# Patient Record
Sex: Male | Born: 1955 | Race: Black or African American | Hispanic: No | Marital: Married | Smoking: Former smoker
Health system: Southern US, Community
[De-identification: ages and names within clinical notes are randomized; demographics above are authoritative.]

## PROBLEM LIST (undated history)

## (undated) DIAGNOSIS — J302 Other seasonal allergic rhinitis: Secondary | ICD-10-CM

## (undated) DIAGNOSIS — I10 Essential (primary) hypertension: Secondary | ICD-10-CM

## (undated) DIAGNOSIS — E119 Type 2 diabetes mellitus without complications: Secondary | ICD-10-CM

## (undated) DIAGNOSIS — M4800 Spinal stenosis, site unspecified: Secondary | ICD-10-CM

## (undated) DIAGNOSIS — M1990 Unspecified osteoarthritis, unspecified site: Secondary | ICD-10-CM

## (undated) DIAGNOSIS — E878 Other disorders of electrolyte and fluid balance, not elsewhere classified: Secondary | ICD-10-CM

## (undated) DIAGNOSIS — F419 Anxiety disorder, unspecified: Secondary | ICD-10-CM

## (undated) DIAGNOSIS — J4489 Other specified chronic obstructive pulmonary disease: Secondary | ICD-10-CM

## (undated) DIAGNOSIS — G2581 Restless legs syndrome: Secondary | ICD-10-CM

## (undated) DIAGNOSIS — R7303 Prediabetes: Secondary | ICD-10-CM

## (undated) DIAGNOSIS — E785 Hyperlipidemia, unspecified: Secondary | ICD-10-CM

## (undated) DIAGNOSIS — G4730 Sleep apnea, unspecified: Secondary | ICD-10-CM

## (undated) DIAGNOSIS — F410 Panic disorder [episodic paroxysmal anxiety] without agoraphobia: Secondary | ICD-10-CM

## (undated) DIAGNOSIS — K219 Gastro-esophageal reflux disease without esophagitis: Secondary | ICD-10-CM

## (undated) DIAGNOSIS — J45909 Unspecified asthma, uncomplicated: Secondary | ICD-10-CM

## (undated) HISTORY — PX: REPLACEMENT TOTAL KNEE: SUR1224

## (undated) HISTORY — PX: LUMBAR DISC SURGERY: SHX700

## (undated) HISTORY — PX: COLONOSCOPY: SHX174

## (undated) HISTORY — DX: Unspecified osteoarthritis, unspecified site: M19.90

## (undated) HISTORY — PX: GASTRIC RESECTION: SHX5248

## (undated) HISTORY — PX: BACK SURGERY: SHX140

## (undated) HISTORY — DX: Type 2 diabetes mellitus without complications: E11.9

## (undated) HISTORY — DX: Panic disorder (episodic paroxysmal anxiety): F41.0

## (undated) HISTORY — DX: Anxiety disorder, unspecified: F41.9

---

## 2003-08-12 ENCOUNTER — Ambulatory Visit (HOSPITAL_COMMUNITY): Admission: RE | Admit: 2003-08-12 | Discharge: 2003-08-12 | Payer: Self-pay | Admitting: Family Medicine

## 2003-08-29 ENCOUNTER — Ambulatory Visit (HOSPITAL_COMMUNITY): Admission: RE | Admit: 2003-08-29 | Discharge: 2003-08-29 | Payer: Self-pay | Admitting: Family Medicine

## 2003-09-07 ENCOUNTER — Inpatient Hospital Stay (HOSPITAL_COMMUNITY): Admission: EM | Admit: 2003-09-07 | Discharge: 2003-09-10 | Payer: Self-pay | Admitting: Emergency Medicine

## 2004-01-31 ENCOUNTER — Ambulatory Visit: Payer: Self-pay | Admitting: Family Medicine

## 2004-02-01 ENCOUNTER — Ambulatory Visit (HOSPITAL_COMMUNITY): Admission: RE | Admit: 2004-02-01 | Discharge: 2004-02-01 | Payer: Self-pay | Admitting: Family Medicine

## 2004-02-03 ENCOUNTER — Ambulatory Visit (HOSPITAL_COMMUNITY): Admission: RE | Admit: 2004-02-03 | Discharge: 2004-02-03 | Payer: Self-pay | Admitting: Family Medicine

## 2004-02-10 ENCOUNTER — Ambulatory Visit: Payer: Self-pay | Admitting: *Deleted

## 2004-03-02 ENCOUNTER — Ambulatory Visit: Payer: Self-pay | Admitting: Family Medicine

## 2004-04-01 ENCOUNTER — Ambulatory Visit: Payer: Self-pay | Admitting: Family Medicine

## 2004-04-06 ENCOUNTER — Ambulatory Visit: Payer: Self-pay | Admitting: Family Medicine

## 2004-07-06 ENCOUNTER — Ambulatory Visit: Payer: Self-pay | Admitting: Family Medicine

## 2004-07-16 ENCOUNTER — Ambulatory Visit: Payer: Self-pay | Admitting: Family Medicine

## 2004-08-06 ENCOUNTER — Ambulatory Visit: Payer: Self-pay | Admitting: Cardiovascular Disease

## 2004-08-24 ENCOUNTER — Ambulatory Visit: Payer: Self-pay

## 2004-09-03 ENCOUNTER — Ambulatory Visit: Payer: Self-pay | Admitting: Family Medicine

## 2004-09-04 ENCOUNTER — Ambulatory Visit (HOSPITAL_COMMUNITY): Admission: RE | Admit: 2004-09-04 | Discharge: 2004-09-04 | Payer: Self-pay | Admitting: Family Medicine

## 2004-09-21 ENCOUNTER — Ambulatory Visit: Payer: Self-pay | Admitting: Family Medicine

## 2004-10-19 ENCOUNTER — Emergency Department (HOSPITAL_COMMUNITY): Admission: EM | Admit: 2004-10-19 | Discharge: 2004-10-19 | Payer: Self-pay | Admitting: Emergency Medicine

## 2004-11-25 ENCOUNTER — Ambulatory Visit: Payer: Self-pay | Admitting: Family Medicine

## 2004-11-27 ENCOUNTER — Ambulatory Visit (HOSPITAL_COMMUNITY): Admission: RE | Admit: 2004-11-27 | Discharge: 2004-11-27 | Payer: Self-pay | Admitting: Family Medicine

## 2004-12-18 ENCOUNTER — Ambulatory Visit: Payer: Self-pay | Admitting: Family Medicine

## 2004-12-21 ENCOUNTER — Ambulatory Visit: Payer: Self-pay | Admitting: Internal Medicine

## 2005-02-01 ENCOUNTER — Emergency Department (HOSPITAL_COMMUNITY): Admission: EM | Admit: 2005-02-01 | Discharge: 2005-02-01 | Payer: Self-pay | Admitting: Emergency Medicine

## 2005-02-03 ENCOUNTER — Ambulatory Visit: Payer: Self-pay | Admitting: Family Medicine

## 2005-03-08 ENCOUNTER — Ambulatory Visit: Payer: Self-pay | Admitting: Nurse Practitioner

## 2005-03-15 ENCOUNTER — Ambulatory Visit: Payer: Self-pay | Admitting: Family Medicine

## 2005-03-17 ENCOUNTER — Ambulatory Visit (HOSPITAL_COMMUNITY): Admission: RE | Admit: 2005-03-17 | Discharge: 2005-03-17 | Payer: Self-pay | Admitting: Family Medicine

## 2005-03-18 ENCOUNTER — Emergency Department (HOSPITAL_COMMUNITY): Admission: EM | Admit: 2005-03-18 | Discharge: 2005-03-18 | Payer: Self-pay | Admitting: Emergency Medicine

## 2005-03-19 ENCOUNTER — Emergency Department (HOSPITAL_COMMUNITY): Admission: EM | Admit: 2005-03-19 | Discharge: 2005-03-19 | Payer: Self-pay | Admitting: Emergency Medicine

## 2005-03-29 ENCOUNTER — Emergency Department (HOSPITAL_COMMUNITY): Admission: EM | Admit: 2005-03-29 | Discharge: 2005-03-29 | Payer: Self-pay | Admitting: Family Medicine

## 2005-03-30 ENCOUNTER — Ambulatory Visit: Payer: Self-pay | Admitting: Family Medicine

## 2005-04-28 ENCOUNTER — Ambulatory Visit: Payer: Self-pay | Admitting: Family Medicine

## 2005-06-29 ENCOUNTER — Ambulatory Visit: Payer: Self-pay | Admitting: Family Medicine

## 2005-07-23 ENCOUNTER — Inpatient Hospital Stay (HOSPITAL_COMMUNITY): Admission: RE | Admit: 2005-07-23 | Discharge: 2005-07-26 | Payer: Self-pay | Admitting: Orthopedic Surgery

## 2005-08-16 ENCOUNTER — Encounter: Admission: RE | Admit: 2005-08-16 | Discharge: 2005-11-14 | Payer: Self-pay | Admitting: Orthopedic Surgery

## 2005-09-08 ENCOUNTER — Ambulatory Visit (HOSPITAL_COMMUNITY): Admission: RE | Admit: 2005-09-08 | Discharge: 2005-09-09 | Payer: Self-pay | Admitting: Orthopedic Surgery

## 2005-09-24 ENCOUNTER — Ambulatory Visit: Payer: Self-pay | Admitting: Family Medicine

## 2005-10-11 ENCOUNTER — Ambulatory Visit: Payer: Self-pay | Admitting: Family Medicine

## 2005-11-15 ENCOUNTER — Encounter: Admission: RE | Admit: 2005-11-15 | Discharge: 2006-02-13 | Payer: Self-pay | Admitting: Orthopedic Surgery

## 2006-04-20 ENCOUNTER — Encounter: Admission: RE | Admit: 2006-04-20 | Discharge: 2006-04-20 | Payer: Self-pay | Admitting: Internal Medicine

## 2006-12-06 DIAGNOSIS — M1710 Unilateral primary osteoarthritis, unspecified knee: Secondary | ICD-10-CM

## 2006-12-06 DIAGNOSIS — E7800 Pure hypercholesterolemia, unspecified: Secondary | ICD-10-CM | POA: Insufficient documentation

## 2006-12-06 DIAGNOSIS — Z87898 Personal history of other specified conditions: Secondary | ICD-10-CM | POA: Insufficient documentation

## 2006-12-06 DIAGNOSIS — F411 Generalized anxiety disorder: Secondary | ICD-10-CM | POA: Insufficient documentation

## 2006-12-06 DIAGNOSIS — J309 Allergic rhinitis, unspecified: Secondary | ICD-10-CM | POA: Insufficient documentation

## 2006-12-06 DIAGNOSIS — F329 Major depressive disorder, single episode, unspecified: Secondary | ICD-10-CM

## 2006-12-06 DIAGNOSIS — D3500 Benign neoplasm of unspecified adrenal gland: Secondary | ICD-10-CM | POA: Insufficient documentation

## 2006-12-06 DIAGNOSIS — M5137 Other intervertebral disc degeneration, lumbosacral region: Secondary | ICD-10-CM

## 2006-12-06 DIAGNOSIS — K279 Peptic ulcer, site unspecified, unspecified as acute or chronic, without hemorrhage or perforation: Secondary | ICD-10-CM | POA: Insufficient documentation

## 2006-12-06 DIAGNOSIS — I10 Essential (primary) hypertension: Secondary | ICD-10-CM | POA: Insufficient documentation

## 2006-12-06 DIAGNOSIS — M51379 Other intervertebral disc degeneration, lumbosacral region without mention of lumbar back pain or lower extremity pain: Secondary | ICD-10-CM | POA: Insufficient documentation

## 2006-12-24 ENCOUNTER — Emergency Department (HOSPITAL_COMMUNITY): Admission: EM | Admit: 2006-12-24 | Discharge: 2006-12-24 | Payer: Self-pay | Admitting: Family Medicine

## 2007-09-09 ENCOUNTER — Emergency Department (HOSPITAL_COMMUNITY): Admission: EM | Admit: 2007-09-09 | Discharge: 2007-09-09 | Payer: Self-pay | Admitting: Emergency Medicine

## 2007-09-26 ENCOUNTER — Emergency Department (HOSPITAL_COMMUNITY): Admission: EM | Admit: 2007-09-26 | Discharge: 2007-09-26 | Payer: Self-pay | Admitting: Emergency Medicine

## 2007-11-22 ENCOUNTER — Ambulatory Visit (HOSPITAL_BASED_OUTPATIENT_CLINIC_OR_DEPARTMENT_OTHER): Admission: RE | Admit: 2007-11-22 | Discharge: 2007-11-22 | Payer: Self-pay | Admitting: Orthopedic Surgery

## 2007-11-25 ENCOUNTER — Emergency Department (HOSPITAL_COMMUNITY): Admission: EM | Admit: 2007-11-25 | Discharge: 2007-11-25 | Payer: Self-pay | Admitting: Emergency Medicine

## 2008-02-13 ENCOUNTER — Emergency Department (HOSPITAL_COMMUNITY): Admission: EM | Admit: 2008-02-13 | Discharge: 2008-02-13 | Payer: Self-pay | Admitting: Emergency Medicine

## 2008-03-25 ENCOUNTER — Emergency Department (HOSPITAL_COMMUNITY): Admission: EM | Admit: 2008-03-25 | Discharge: 2008-03-25 | Payer: Self-pay | Admitting: Emergency Medicine

## 2008-04-17 ENCOUNTER — Inpatient Hospital Stay (HOSPITAL_COMMUNITY): Admission: RE | Admit: 2008-04-17 | Discharge: 2008-04-21 | Payer: Self-pay | Admitting: Orthopedic Surgery

## 2008-04-17 ENCOUNTER — Ambulatory Visit: Payer: Self-pay | Admitting: *Deleted

## 2008-05-13 ENCOUNTER — Emergency Department (HOSPITAL_COMMUNITY): Admission: EM | Admit: 2008-05-13 | Discharge: 2008-05-13 | Payer: Self-pay | Admitting: Emergency Medicine

## 2008-05-13 ENCOUNTER — Encounter (INDEPENDENT_AMBULATORY_CARE_PROVIDER_SITE_OTHER): Payer: Self-pay | Admitting: Emergency Medicine

## 2008-05-13 ENCOUNTER — Encounter: Admission: RE | Admit: 2008-05-13 | Discharge: 2008-08-11 | Payer: Self-pay | Admitting: Orthopedic Surgery

## 2008-06-27 ENCOUNTER — Emergency Department (HOSPITAL_COMMUNITY): Admission: EM | Admit: 2008-06-27 | Discharge: 2008-06-27 | Payer: Self-pay | Admitting: Emergency Medicine

## 2008-06-30 ENCOUNTER — Emergency Department (HOSPITAL_COMMUNITY): Admission: EM | Admit: 2008-06-30 | Discharge: 2008-06-30 | Payer: Self-pay | Admitting: Emergency Medicine

## 2008-10-23 ENCOUNTER — Emergency Department (HOSPITAL_COMMUNITY): Admission: EM | Admit: 2008-10-23 | Discharge: 2008-10-23 | Payer: Self-pay | Admitting: Emergency Medicine

## 2008-11-13 ENCOUNTER — Emergency Department (HOSPITAL_COMMUNITY): Admission: EM | Admit: 2008-11-13 | Discharge: 2008-11-13 | Payer: Self-pay | Admitting: Emergency Medicine

## 2009-04-10 ENCOUNTER — Encounter: Admission: RE | Admit: 2009-04-10 | Discharge: 2009-05-01 | Payer: Self-pay | Admitting: Neurology

## 2009-05-27 ENCOUNTER — Emergency Department (HOSPITAL_COMMUNITY): Admission: EM | Admit: 2009-05-27 | Discharge: 2009-05-27 | Payer: Self-pay | Admitting: Emergency Medicine

## 2009-09-11 ENCOUNTER — Emergency Department (HOSPITAL_COMMUNITY): Admission: EM | Admit: 2009-09-11 | Discharge: 2009-09-11 | Payer: Self-pay | Admitting: Emergency Medicine

## 2009-11-19 ENCOUNTER — Encounter
Admission: RE | Admit: 2009-11-19 | Discharge: 2009-12-24 | Payer: Self-pay | Admitting: Physical Medicine and Rehabilitation

## 2009-12-02 ENCOUNTER — Emergency Department (HOSPITAL_COMMUNITY): Admission: EM | Admit: 2009-12-02 | Discharge: 2009-12-03 | Payer: Self-pay | Admitting: Emergency Medicine

## 2010-03-02 ENCOUNTER — Encounter: Admission: RE | Admit: 2010-03-02 | Discharge: 2010-03-02 | Payer: Self-pay | Admitting: Specialist

## 2010-04-01 ENCOUNTER — Ambulatory Visit (HOSPITAL_BASED_OUTPATIENT_CLINIC_OR_DEPARTMENT_OTHER): Admission: RE | Admit: 2010-04-01 | Discharge: 2010-04-01 | Payer: Self-pay | Admitting: Orthopedic Surgery

## 2010-05-06 ENCOUNTER — Ambulatory Visit (HOSPITAL_COMMUNITY)
Admission: RE | Admit: 2010-05-06 | Discharge: 2010-05-07 | Payer: Self-pay | Source: Home / Self Care | Attending: Specialist | Admitting: Specialist

## 2010-05-07 LAB — BASIC METABOLIC PANEL
BUN: 8 mg/dL (ref 6–23)
CO2: 31 mEq/L (ref 19–32)
Calcium: 9.1 mg/dL (ref 8.4–10.5)
Chloride: 102 mEq/L (ref 96–112)
Creatinine, Ser: 1.15 mg/dL (ref 0.4–1.5)
GFR calc Af Amer: >60 mL/min (ref >60)
GFR calc non Af Amer: >60 mL/min (ref >60)
Glucose, Bld: 117 mg/dL — ABNORMAL HIGH (ref 70–99)
Potassium: 4.2 mEq/L (ref 3.5–5.1)
Sodium: 139 mEq/L (ref 135–145)

## 2010-05-07 LAB — CBC
HCT: 36.6 % — ABNORMAL LOW (ref 39.0–52.0)
Hemoglobin: 12 g/dL — ABNORMAL LOW (ref 13.0–17.0)
MCH: 27.4 pg (ref 26.0–34.0)
MCHC: 32.8 g/dL (ref 30.0–36.0)
MCV: 83.6 fL (ref 78.0–100.0)
Platelets: 198 10*3/uL (ref 150–400)
RBC: 4.38 MIL/uL (ref 4.22–5.81)
RDW: 13.7 % (ref 11.5–15.5)
WBC: 8.5 10*3/uL (ref 4.0–10.5)

## 2010-05-23 ENCOUNTER — Encounter: Payer: Self-pay | Admitting: Specialist

## 2010-06-09 ENCOUNTER — Ambulatory Visit: Payer: Self-pay | Admitting: Rehabilitation

## 2010-07-13 LAB — COMPREHENSIVE METABOLIC PANEL
ALT: 26 U/L (ref 0–53)
Albumin: 4.1 g/dL (ref 3.5–5.2)
Alkaline Phosphatase: 42 U/L (ref 39–117)
Creatinine, Ser: 1.09 mg/dL (ref 0.4–1.5)
GFR calc Af Amer: >60 mL/min (ref >60)
Potassium: 3.9 mEq/L (ref 3.5–5.1)
Sodium: 140 mEq/L (ref 135–145)
Total Bilirubin: 0.7 mg/dL (ref 0.3–1.2)

## 2010-07-13 LAB — CBC
HCT: 38.8 % — ABNORMAL LOW (ref 39.0–52.0)
Hemoglobin: 13.2 g/dL (ref 13.0–17.0)
MCH: 28.3 pg (ref 26.0–34.0)
MCHC: 34 g/dL (ref 30.0–36.0)
MCV: 83.3 fL (ref 78.0–100.0)
Platelets: 258 10*3/uL (ref 150–400)
RBC: 4.66 MIL/uL (ref 4.22–5.81)
WBC: 4.8 10*3/uL (ref 4.0–10.5)

## 2010-07-13 LAB — URINALYSIS, ROUTINE W REFLEX MICROSCOPIC
Glucose, UA: NEGATIVE mg/dL
Urobilinogen, UA: 1 mg/dL (ref 0.0–1.0)

## 2010-07-13 LAB — SURGICAL PCR SCREEN: Staphylococcus aureus: NEGATIVE

## 2010-07-13 LAB — PROTIME-INR: Prothrombin Time: 12.7 seconds (ref 11.6–15.2)

## 2010-07-14 LAB — POCT I-STAT 4, (NA,K, GLUC, HGB,HCT)
Glucose, Bld: 92 mg/dL (ref 70–99)
HCT: 43 % (ref 39.0–52.0)
Potassium: 4.7 mEq/L (ref 3.5–5.1)
Sodium: 141 mEq/L (ref 135–145)

## 2010-07-19 LAB — URINALYSIS, ROUTINE W REFLEX MICROSCOPIC
Bilirubin Urine: NEGATIVE
Glucose, UA: NEGATIVE mg/dL
Ketones, ur: NEGATIVE mg/dL
Protein, ur: NEGATIVE mg/dL
pH: 6 (ref 5.0–8.0)

## 2010-07-19 LAB — COMPREHENSIVE METABOLIC PANEL
ALT: 28 U/L (ref 0–53)
AST: 30 U/L (ref 0–37)
Albumin: 4.1 g/dL (ref 3.5–5.2)
Calcium: 9.6 mg/dL (ref 8.4–10.5)
Creatinine, Ser: 1.08 mg/dL (ref 0.4–1.5)
GFR calc Af Amer: >60 mL/min (ref >60)
Sodium: 138 mEq/L (ref 135–145)
Total Protein: 7.1 g/dL (ref 6.0–8.3)

## 2010-07-19 LAB — POCT CARDIAC MARKERS
Myoglobin, poc: 69.8 ng/mL (ref 12–200)
Troponin i, poc: <0.05 ng/mL (ref 0.00–0.09)
Troponin i, poc: <0.05 ng/mL (ref 0.00–0.09)

## 2010-07-19 LAB — DIFFERENTIAL
Eosinophils Absolute: 0.1 10*3/uL (ref 0.0–0.7)
Eosinophils Relative: 3 % (ref 0–5)
Lymphocytes Relative: 35 % (ref 12–46)
Lymphs Abs: 1.5 10*3/uL (ref 0.7–4.0)
Monocytes Relative: 9 % (ref 3–12)

## 2010-07-19 LAB — CBC
MCHC: 34.7 g/dL (ref 30.0–36.0)
MCV: 84.6 fL (ref 78.0–100.0)
Platelets: 244 10*3/uL (ref 150–400)
RBC: 4.84 MIL/uL (ref 4.22–5.81)
RDW: 13.9 % (ref 11.5–15.5)

## 2010-08-17 LAB — CBC
HCT: 36 % — ABNORMAL LOW (ref 39.0–52.0)
MCHC: 33.4 g/dL (ref 30.0–36.0)
MCV: 83.1 fL (ref 78.0–100.0)
Platelets: 350 10*3/uL (ref 150–400)
RDW: 14.2 % (ref 11.5–15.5)

## 2010-08-17 LAB — BASIC METABOLIC PANEL
BUN: 13 mg/dL (ref 6–23)
Chloride: 104 mEq/L (ref 96–112)
Creatinine, Ser: 0.93 mg/dL (ref 0.4–1.5)
Glucose, Bld: 96 mg/dL (ref 70–99)
Potassium: 4.1 mEq/L (ref 3.5–5.1)

## 2010-08-17 LAB — D-DIMER, QUANTITATIVE: D-Dimer, Quant: 3 ug/mL-FEU — ABNORMAL HIGH (ref 0.00–0.48)

## 2010-08-17 LAB — DIFFERENTIAL
Basophils Absolute: 0.1 10*3/uL (ref 0.0–0.1)
Basophils Relative: 2 % — ABNORMAL HIGH (ref 0–1)
Eosinophils Absolute: 0.1 10*3/uL (ref 0.0–0.7)
Eosinophils Relative: 2 % (ref 0–5)
Neutrophils Relative %: 65 % (ref 43–77)

## 2010-08-18 LAB — CBC
HCT: 37.9 % — ABNORMAL LOW (ref 39.0–52.0)
Platelets: 274 10*3/uL (ref 150–400)
RDW: 14.3 % (ref 11.5–15.5)
WBC: 3.4 10*3/uL — ABNORMAL LOW (ref 4.0–10.5)

## 2010-08-18 LAB — URINALYSIS, ROUTINE W REFLEX MICROSCOPIC
Bilirubin Urine: NEGATIVE
Glucose, UA: NEGATIVE mg/dL
Hgb urine dipstick: NEGATIVE
Specific Gravity, Urine: 1.02 (ref 1.005–1.030)

## 2010-08-18 LAB — COMPREHENSIVE METABOLIC PANEL
AST: 26 U/L (ref 0–37)
Albumin: 4.2 g/dL (ref 3.5–5.2)
Alkaline Phosphatase: 51 U/L (ref 39–117)
BUN: 9 mg/dL (ref 6–23)
Chloride: 101 mEq/L (ref 96–112)
GFR calc Af Amer: >60 mL/min (ref >60)
Potassium: 3.8 mEq/L (ref 3.5–5.1)
Sodium: 134 mEq/L — ABNORMAL LOW (ref 135–145)
Total Protein: 6.5 g/dL (ref 6.0–8.3)

## 2010-08-18 LAB — POCT I-STAT, CHEM 8
BUN: 10 mg/dL (ref 6–23)
Creatinine, Ser: 1 mg/dL (ref 0.4–1.5)
Potassium: 4 mEq/L (ref 3.5–5.1)
Sodium: 141 mEq/L (ref 135–145)

## 2010-08-18 LAB — DIFFERENTIAL
Basophils Relative: 0 % (ref 0–1)
Eosinophils Relative: 2 % (ref 0–5)
Monocytes Absolute: 0.3 10*3/uL (ref 0.1–1.0)
Monocytes Relative: 7 % (ref 3–12)
Neutro Abs: 2.4 10*3/uL (ref 1.7–7.7)

## 2010-09-15 NOTE — Discharge Summary (Signed)
[name], [name] NO.:  [id number]   MEDICAL RECORD NO.:  [id number]          PATIENT TYPE:  INP   LOCATION:  1609                         FACILITY:  [facility]   PHYSICIAN:  Ollen Gross, M.D.    DATE OF BIRTH:  1956/03/15   DATE OF ADMISSION:  04/17/2008  DATE OF DISCHARGE:  04/21/2008                               DISCHARGE SUMMARY   ADMISSION DIAGNOSES:  1. End-stage osteoarthritis of the left knee.  2. Anxiety.  3. Hypertension.  4. Hypercholesterolemia.  5. Reflux disease.  6. Benign prostatic hypertrophy.   DISCHARGE DIAGNOSES:  1. Left total knee arthroplasty.  2. Anxiety.  3. Hypertension.  4. Hypercholesterolemia.  5. Reflux disease.  6. Benign prostatic hypertrophy.   HISTORY OF PRESENT ILLNESS:  The patient is a 55 year old male with a  history of pain and difficulty with range of motion with his left knee.  He has failed conservative treatment.  He elected to proceed with a  total knee arthroplasty.  X-ray shows he has end-stage osteoarthritis of  the left knee.   SURGICAL PROCEDURES:  On April 17, 2008, the patient was taken to the  OR by Dr. Lequita Halt, assisted by Avel Peace, PA-C.  Under general  anesthesia, the patient underwent a left total knee arthroplasty with a  DePuy rotating platform system.  Estimated blood loss was 200 mL.  No  drains.  Tourniquet time was 13 minutes.  The patient had the following  components implanted:  A size 4 femoral component; a size 3 keel tibial  tray; a size 4 10 mL thickness polyethylene bearing; a size 41 3-peg  patella.  All components were implanted with polymethyl methacrylate.  The patient was transferred to recovery room and then to the orthopedic  floor to follow a total knee protocol.   CONSULTANTS:  Following routine consults were requested:  1. Physical Therapy.  2. Case Management.  3. Pharmacy for Coumadin monitoring.   HOSPITAL COURSE:  On April 17, 2008, the patient was  admitted to  [facility] under the care of Dr. Ollen Gross.  The patient  was taken to the OR where a left total knee arthroplasty was performed  without any complications.  The patient was transferred to the recovery  room and then to the orthopedic floor in good condition with IV pain  medicines, antibiotics and Coumadin for DVT prophylaxis.  The patient  then incurred 3 days postoperative course in which the patient had no  significant untoward events.  He was able to wean off his IV antibiotics  and medications to use p.o. meds well.  His wound remained benign for  any signs of infection.  His leg remained neuromotor and vascularly  intact.  Vital signs remained stable.  He remained afebrile.  The  patient worked well with physical therapy.  He was able to ambulate  greater than 260 feet.  Arrangements were made on postop day number 3  for outpatient home health physical therapy and Coumadin monitoring per  protocol and he was discharged in good condition.   LABORATORY DATA:  CBC on admission found WBCs 4.9, hemoglobin 13.9,  hematocrit 41.6, platelets 257, H and H on discharge was 10.4 and 30.5.  INR was 2.0 on discharge.  Routine chemistries remained within normal  limits during his hospitalization except for his glucose which was  slightly labile between 109-140.  No medication changes were made.  Urinalysis on admission was within normal limits.   DIAGNOSTICS:  Chest x-ray on admission found no acute or active process  identified.   DISCHARGE INSTRUCTIONS:  1. Diet is a heart-healthy diet.  2. Activity:  May increase activity slowly with using of crutches.  He      may shower.  3. Wound care:  He is to change his dressing on a daily basis.   FOLLOW UP:  1. He needs a follow up appointment with Dr. Lequita Halt 2 weeks from his      date of surgery.  He is to call [phone] for that follow up      appointment.  2. Home health physical therapy and RN for Coumadin  monitoring through      [facility].   MEDICATIONS:  1. Percocet 5/325 one-two tablets every 4-6 hours for pain if needed.  2. Robaxin one 500 mg tablet every 6 hours for muscle spasms if      needed.  3. Coumadin 10 mg a day unless changed by [facility] physical      therapy pharmacist.  4. Pravastatin 20 mg once a day.  5. Darvocet on hold until done with Percocet  6. Doxazosin 8 mg once a day.  7. Trazodone 50 mg one-half to 1 tablet before bedtime.  8. Flexeril on hold until done with Robaxin.  9. Risperidone 3 mg 2 tablets at bedtime.  10.Nexium 40 mg once a day.   CONDITION ON DISCHARGE:  Improved and good.      Jamelle Rushing, P.A.      Ollen Gross, M.D.  Electronically Signed   RWK/MEDQ  D:  06/05/2008  T:  06/05/2008  Job:  [id number]   cc:   Ollen Gross, M.D.  Fax: [phone]

## 2010-09-15 NOTE — Op Note (Signed)
[name], [name] NO.:  [id number]   MEDICAL RECORD NO.:  [id number]          PATIENT TYPE:  AMB   LOCATION:  NESC                         FACILITY:  [facility]   PHYSICIAN:  Ollen Gross, M.D.    DATE OF BIRTH:  [birth year]   DATE OF PROCEDURE:  11/22/2007  DATE OF DISCHARGE:                               OPERATIVE REPORT   PREOPERATIVE DIAGNOSIS:  Left medial meniscal tear.   POSTOPERATIVE DIAGNOSIS:  Left medial meniscal tear, plus chondral  defect, medial femoral condyle.   PROCEDURE:  Left knee arthroscopy with meniscal debridement and  chondroplasty.   SURGEON:  Ollen Gross, M.D.   ASSISTANT:  None.   ANESTHESIA:  General.   ESTIMATED BLOOD LOSS:  Minimal.   DRAINS:  None.   COMPLICATIONS:  In good condition and stable to recovery room.   BRIEF CLINICAL NOTE:  [name] is a 55 year old male who has had a several-  month history of progressively worsening left knee pain and mechanical  symptoms.  Plain radiographs show minimal degenerative change.  MRA  showed a medial meniscal tear.  Exam and history were consistent with  that.  He has failed nonoperative management and presents for  arthroscopic debridement.   PROCEDURE IN DETAIL:  After successful administration of general  anesthetic, the patient's left lower extremity was prepped and draped in  the usual sterile fashion.  Standard superomedial and inferolateral  incisions were made.  An inflow cannula passed superomedial, camera  passed inferolateral.  Arthroscopic visualization proceeds.  The under-  surface of the patella shows some grade 2 and 3 changes diffusely.  There is a tiny area of grade 4 change.  The trochlea has some grade 2  and 3 changes but no exposed bone.  Medial and lateral gutters are  visualized.  There are no loose bodies.  Flexion and valgus force  applied until the medial compartment is entered.  He does have a  significant tear, body and posterior horn of the medial  meniscus.  He  also has about a 1 x 2 cm area of chondral delamination on the medial  femoral condyle.  A spinal needle was used to localize the inferior  medial portal.  A small incision made, dilator placed, and the meniscus  is debrided back to a stable base with baskets and a 4.2 mm shaver and  then sealed off with the ArthroCare device.  The shaver was then used to  debride the unstable cartilage from the surface of the medial femoral  condyle and then to round off the cartilaginous edges.  I abraded the  bone in that exposed area.  This was about a 1 x 2 cm area.  The rest of  the condyle had minimal involvement.  The intercondylar notch is  visualized.  The ACL looks normal.  The lateral compartment is entered,  and it looks normal.  I inspected the joint again.  There are no further  tears, loose bodies, or chondral defects.  Arthroscopic equipment  was then removed from the inferior portals, which were closed with  interrupted  4-0 nylon.  Then 20 cc of 0.25% Marcaine with epi were  injected through the inflow cannula, then that is removed and that  portal closed with nylon.  A bulky sterile dressing is applied.  He is  then awakened and transported to recovery in stable condition.      Ollen Gross, M.D.  Electronically Signed     FA/MEDQ  D:  11/22/2007  T:  11/22/2007  Job:  [id number]

## 2010-09-15 NOTE — Op Note (Signed)
[name], [name] NO.:  [id number]   MEDICAL RECORD NO.:  [id number]          PATIENT TYPE:  INP   LOCATION:  1609                         FACILITY:  [facility]   PHYSICIAN:  Ollen Gross, M.D.    DATE OF BIRTH:  12-09-1955   DATE OF PROCEDURE:  04/17/2008  DATE OF DISCHARGE:                               OPERATIVE REPORT   PREOPERATIVE DIAGNOSIS:  Osteoarthritis, left knee.   POSTOPERATIVE DIAGNOSIS:  Osteoarthritis, left knee.   PROCEDURE:  Left total knee arthroplasty.   SURGEON:  Ollen Gross, M.D.   ASSISTANT:  Avel Peace P.A.-C   ANESTHESIA:  General, with postop Marcaine pain pump.   ESTIMATED BLOOD LOSS:  200.   DRAIN:  None.   TOURNIQUET TIME:  13 minutes at 300 mmHg.   COMPLICATIONS:  None.   CONDITION.:  Stable to recovery.   CLINICAL NOTE:  [name] is a 55 year old male who has developed severe end-  stage arthritis of the left knee with progressive worsening pain and  dysfunction.  He has failed nonoperative management and presents for  total knee arthroplasty.   PROCEDURE IN DETAIL:  After successful administration of general  anesthetic, a tourniquet was placed on the left thigh and left lower  extremity prepped and draped in the usual sterile fashion.  The  extremity was wrapped with an Esmarch, the knee flexed, the tourniquet  inflated to 300 mmHg.  Midline incision was made with a 10-blade through  subcutaneous tissues to the level of the extensor mechanism.  A fresh  blade was used to make a medial parapatellar arthrotomy.  Soft tissue of  the proximal medial tibia is subperiosteally elevated to the joint line  with the knife into the semimembranosus bursa with a Cobb elevator.  Soft tissue laterally is elevated with attention being paid to avoiding  the patellar tendon on tibial tubercle.  Patella subluxed laterally,  knee flexed 90 degrees, and ACL and PCL removed.  A drill was used to  create a starting hole in the distal  femur.  A 5-degree left valgus  alignment guide was placed and referencing off the posterior condyles  rotations marked and the block pinned to remove 11 mm off the distal  femur.  Eleven mm was taken because of preop flexion contracture.  Distal femoral resection was made with an oscillating saw.  Sizing block  was placed and size 4 is most appropriate.  Rotation is marked off the  epicondylar axis.  A size 4 cutting block was placed and the anterior-  posterior and chamfer cuts made.   The tibia subluxed forward and the menisci are removed.  The  extramedullary tibial alignment guide was placed referencing proximally  at the medial aspect of the tibial tubercle and distally along the  second metatarsal axis and tibial crest.  The block is pinned to remove  about 10 mm of the nondeficient lateral side.  Tibial resection was made  with an oscillating saw.  Size 3 is the most appropriate tibial  component and the proximal tibia was prepared with the modular drill and  keel punch for the size 3.  Femoral preparation is completed with the  intercondylar cut.   Size 3 mobile-bearing tibial trial, size 4 posterior stabilized femoral  trial, and a 10-mm posterior stabilized rotating platform insert trial  are placed.  With the 10 full extension is achieved with excellent varus-  valgus, anterior-posterior balance throughout full range of motion.  The  patella was then everted, it was measured to 24 mm.  Freehand resection  is taken to 14 mm, 41 template is placed, lug holes were drilled, trial  patella was placed and it tracks normally.  Osteophytes are removed off  the posterior femur.  All trials were removed and the cut bone surfaces  are prepared with pulsatile lavage.  Cement was mixed and once ready for  implantation the size 3 mobile-bearing tibial tray, size 4 posterior  stabilized femur, and 41 patella are cemented into place and the patella  was held with the clamp.  Trial 10-mm  insert was placed, knee held in  full extension, and all extruded cement removed.  Please note that the  tourniquet was not functioning well during the procedure and it had been  released at the total time of 13 minutes.  Once the cement was hardened  and the permanent inserts in, then the wound was copiously irrigated  with saline solution.  The FloSeal was injected in the medial and  lateral gutters and suprapatellar area.  A moist sponge is placed.  We  held this for about 2 minutes and then removed it.  Essentially all the  bleeding had stopped at that point.  Any bleeding that still persisted  was stopped with electrocautery.  The wound was then copiously irrigated  again with saline solution and the arthrotomy closed with interrupted #1  PDS.  Flexion against gravity is 135 degrees.  Subcu was closed with  interrupted 2-0 Vicryl and subcuticular running 4-0 Monocryl.  The  incision is cleaned and dried and then the catheter for Marcaine pain  pump is placed and the pump is initiated.  Steri-Strips were placed as  well as a bulky sterile dressing.  He is then placed into a knee  immobilizer, awakened, and transferred to recovery in stable condition.      Ollen Gross, M.D.  Electronically Signed     FA/MEDQ  D:  04/17/2008  T:  04/18/2008  Job:  [id number]

## 2010-09-18 NOTE — Op Note (Signed)
NAMENEMESIO, CASTRILLON NO.:  [id number]   MEDICAL RECORD NO.:  [id number]           PATIENT TYPE:   LOCATION:                                 FACILITY:   PHYSICIAN:  Ollen Gross, M.D.         DATE OF BIRTH:   DATE OF PROCEDURE:  09/08/2005  DATE OF DISCHARGE:                                 OPERATIVE REPORT   PREOPERATIVE DIAGNOSIS:  Arthrofibrosis right knee.   POSTOPERATIVE DIAGNOSIS:  Arthrofibrosis right knee.   PROCEDURE:  Right knee closed manipulation.   SURGEON:  Ollen Gross, M.D.   ANESTHESIA:  General.  Premanipulation, range of motion 20-70;  postmanipulation range of motion 5-120.   COMPLICATIONS:  None.   CONDITION:  Stable to recovery.   BRIEF CLINICAL NOTE:  Akari is a 55 year old male who has arthrofibrosis of  his right knee post total knee arthroplasty.  He had his operation done on  07/23/2005 and has not progressed well with therapy.  He has been stuck at  about 70-75 degrees of flexion for several weeks now.  He has scar tissue  that has formed, and presents now for closed manipulation.   PROCEDURE IN DETAIL:  After successful administration of general anesthetic,  exam under anesthesia showed range 20-70.  By placing my chest on his  proximal tibia, I flexed the knee and the adhesions are audibly lysed.  I  was easily able to get him flexed to 120 degrees.  His patella mobility  improved significantly.  I was able to get him extended to within 5 degrees  of full extension.  We placed him through a range of motion, again, and  achieved the same range.  He subsequently awakened and was transported to  recovery in stable condition.      Ollen Gross, M.D.  Electronically Signed     FA/MEDQ  D:  09/08/2005  T:  09/09/2005  Job:  [id number]

## 2010-09-18 NOTE — Discharge Summary (Signed)
[name], [name] NO.:  [id number]   MEDICAL RECORD NO.:  [id number]         PATIENT TYPE:  LINP   LOCATION:  1510                         FACILITY:  [facility]   PHYSICIAN:  Ollen Gross, M.D.    DATE OF BIRTH:  Nov 14, 1955   DATE OF ADMISSION:  07/23/2005  DATE OF DISCHARGE:                                 DISCHARGE SUMMARY   ADMITTING DIAGNOSES:  1.  Bilateral knees osteoarthritis, right more symptomatic than left.  2.  History of panic attacks.  3.  Anxiety.  4.  Migraines.  5.  Allergies.  6.  Sinus problems.  7.  Hypertension.  8.  Reflux disease.  9.  History of ulcers.  10. Arthritis.   DISCHARGE DIAGNOSES:  1.  Osteoarthritis, right knee, status post right total knee arthroplasty.  2.  Osteoarthritis, left knee.  3.  Mild postoperative blood loss anemia, did not require transfusion.  4.  Hypokalemia.  5.  History of panic attacks.  6.  Anxiety.  7.  Migraines.  8.  Allergies.  9.  Sinus problems.  10. Hypertension.  11. Reflux disease.  12. History of ulcers.  13. Arthritis.   PROCEDURE:  On July 23, 2005, right total knee.  Surgeon:  Ollen Gross,  M.D.  Assistant:  A. Perkins, PA-C.  Anesthesia general.  Postop pain pump, Hemovac drain x 1.  Tourniquet time  45 minutes.   CONSULTS:  None.   BRIEF HISTORY:  [name] is a 55 year old male with end-stage osteoarthritis of  the right knee with intractable pain, large loose body in the knee.  Failed  nonoperative management including injections and now presents for total knee  arthroplasty.   LABORATORY DATA:  Preop CBC:  Hemoglobin 14.5, hematocrit 42.9, white cell  count 4.5.  Postop hemoglobin 12.1, last noted H&H 11.2 and 33.8.  PT and  PTT 12.2 and 26, respectively, on admission.  INR 0.9.  Serial pro times  followed and last noted PT and INR 19.3 and 1.6.  Chem panel on admission  all within normal limits.  Serial electrolytes were followed.  Potassium  dropped some from 3.7 to 3.2.   Preop UA negative.  Blood group type B  negative.   EKG dated July 20, 2005, normal sinus rhythm, nonspecific T-wave  abnormalities, no significant change since last tracing from January 19, 2005.  Confirmed by Dr. Kristeen Miss.   HOSPITAL COURSE:  The patient was admitted to [facility] and  tolerated the procedure well.  Later, transferred to the recovery room and  the orthopedic floor.  Started on PCA and p.o. analgesics.  Hemovac drain  placed at surgery was pulled on day one.  Did have a fair amount of pain on  day one but was using PCA.  Weaned over to p.o. meds and DCd the PCA on the  following day.  He did get up out of bed on day one and ambulated about 5  feet.  By day two, pain was much better, low-grade temp.  Treated with  incentive spirometry and antipyretics.  PCA was  discontinued on day two.  From a therapy standpoint, got up and ambulated 80 feet and then later 125  feet later that afternoon.  Did so well on day two, he was ready to go home  by day three.   DISCHARGE PLAN:  The patient was discharged home on July 26, 2005.   DISCHARGE DIAGNOSES:  Please see above.   DISCHARGE MEDICATIONS:  Percocet, Robaxin, Coumadin.   DIET:  Resume previous home diet.   FOLLOWUP:  Follow up in two weeks in the office.   ACTIVITY:  Total knee protocol, weightbearing as tolerated.  Home health PT  and home health nursing.   DISPOSITION:  Home.   CONDITION ON DISCHARGE:  Improved.      [name], P.A.      Ollen Gross, M.D.  Electronically Signed    ALP/MEDQ  D:  09/01/2005  T:  09/02/2005  Job:  [id number]   cc:   Maurice March, M.D.  Fax: [phone]

## 2010-09-18 NOTE — Discharge Summary (Signed)
[name], [name]                            ACCOUNT NO.:  [id number]   MEDICAL RECORD NO.:  [id number]                   PATIENT TYPE:  INP   LOCATION:  5704                                 FACILITY:  [facility]   PHYSICIAN:  Jackie Plum, M.D.             DATE OF BIRTH:  [birth year]   DATE OF ADMISSION:  09/06/2003  DATE OF DISCHARGE:  09/10/2003                                 DISCHARGE SUMMARY   DISCHARGE DIAGNOSES:  1. Abdominal pain, resolved.     a. CT scan of the abdomen and pelvis notable for colonic wall thickening.     b. Colonoscopy done by Dr. Juanda Chance on Sep 09, 2003, was negative for any        inflammatory changes, neoplasia, or mucosal changes.  2. History of hypertension.  3. History of degenerative joint disease.  4. History of allergies.  5. History of low back pain.  6. History of migraines.   DISCHARGE MEDICATIONS:  The patient is going to resume his preadmission  medicines.  He will be taking Protonix 40 mg daily and Cardura for blood  pressure 4 mg p.o. daily.  Milk of magnesia over the counter has been  prescribed for the patient as needed.   ACTIVITY:  As tolerated.   DIET:  To be a low-salt diet.   FOLLOWUP:  The patient is to follow up with Dr. Juanda Chance as needed for  abdominal problems.   CONSULTANTS:  Lina Sar, M.D.   PROCEDURES:  Colonoscopy as noted above.   REASON FOR HOSPITALIZATION:  Abdominal pain.   HISTORY OF PRESENT ILLNESS:  The patient was admitted with right-sided  abdominal pain with a low-grade fever, diaphoresis, and dizziness.  He had  also been vomiting as well.  On evaluation in the ED, the patient was noted  to have colonic wall thickening and the hospitalist service was asked to  evaluate for admission.   PHYSICAL EXAMINATION:  Per H&P per Sherin Quarry, M.D.:  VITAL SIGNS:  His temperature on admission was 100.4 degrees with a BP of  120/90 and a pulse of 110 per minute.  CHEST:  Clear to auscultation.  CARDIAC:  Exam  was unremarkable.  ABDOMEN:  Exam was notable for tenderness in the right lower quadrant and  left lower quadrant.  Bowel sounds were present.   HOSPITAL COURSE:  He was admitted for further management.  He was admitted  to the hospitalist service.  A bowel rest regimen was started.  IV fluid  supplementation.  Pain medications were also given.  Empiric antibiotics  with Unasyn were started.  The patient was seen in consultation by Dr.  Juanda Chance, who recommended followup colonoscopy for further evaluation of  bowel wall thickening.  Colonoscopy as negative.  At this point, the  patient's GI presentation is unclear and he is being discharged home on the  above medications.  Dr. Juanda Chance has requested that  the patient see her on an  as needed basis.   DISCHARGE LABORATORIES:  WBC count 12.1, hemoglobin 14.0, hematocrit 40.4,  MCV 84.4, platelet count 245.  Sodium 139, potassium 4.1, chloride 106, CO2  28, glucose 88, BUN 5, creatinine 0.5, calcium 8.5.                                                Jackie Plum, M.D.    GO/MEDQ  D:  09/10/2003  T:  09/10/2003  Job:  [id number]   cc:   Lina Sar, M.D. [facility]

## 2010-09-18 NOTE — H&P (Signed)
NAMEGREGOREY, NABOR NO.:  [id number]   MEDICAL RECORD NO.:  [id number]          PATIENT TYPE:  INP   LOCATION:  1510                         FACILITY:  [facility]   PHYSICIAN:  Ollen Gross, M.D.    DATE OF BIRTH:  [birth year]   DATE OF ADMISSION:  07/23/2005  DATE OF DISCHARGE:  07/26/2005                                HISTORY & PHYSICAL   CHIEF COMPLAINT:  Right knee pain.   HISTORY OF PRESENT ILLNESS:  The patient is a 55 year old male who has been  seen by Dr. Lequita Halt in second opinion for evaluation of his knees.  He has  had problems ongoing since his teenage years.  He has had surgery on his  right knee in the past and has had significant problems develop since then.  He was recommended back in 2001 that he needed knee replacement but felt he  was too young at that time.  He is at a stage now where essentially his knee  is interfering with his life and his ability to do things he enjoys.  He is  seen in the office where he has end-stage arthritis in the right knee with  bone-on-bone in medial and patellofemoral compartments with large lateral  marginal osteophytes.  He does have some varus inclination of the knee.  It  is felt he reached the point where he would benefit from undergoing knee  replacement.  The risks and benefits have been discussed.  The patient is  subsequently admitted to the hospital.   ALLERGIES:  No known drug allergies.   CURRENT MEDICATIONS:  Protonix, hydrochlorothiazide, pravastatin, doxazosin,  Zyrtec, trazodone, Risperdal, methocarbamol, Asmanex, Nasacort.   PAST MEDICAL HISTORY:  1.  History of panic attacks.  2.  Anxiety.  3.  Migraines.  4.  Allergies.  5.  Sinus problems.  6.  Hypertension.  7.  Reflux disease.  8.  History of ulcers.  9.  Arthritis.   PAST SURGICAL HISTORY:  1.  Head surgery.  2.  Right knee surgery.  3.  Ulcer surgery.   SOCIAL HISTORY:  Married, nonsmoker.  Seldom intake of alcohol - maybe  one  drink a week.  Has three step-children.   FAMILY HISTORY:  Mother with a history of heart disease, hypertension, and  arthritis.   REVIEW OF SYSTEMS:  GENERAL:  No fevers, chills, night sweats. NEURO:  No  seizure, syncope, or paralysis.  RESPIRATORY:  Occasional cough with  allergies.  No shortness of breath, productive cough, or hemoptysis.  CARDIOVASCULAR:  No chest pain, angina, or orthopnea.  GI:  No nausea,  vomiting, diarrhea, constipation.  GU:  No dysuria, hematuria, discharge.  MUSCULOSKELETAL:  Right knee.   PHYSICAL EXAMINATION:  VITAL SIGNS:  Pulse 68, respiratory rate 14, blood  pressure 126/88.  GENERAL:  A 55 year old African-American male, well-nourished, well-  developed, no acute distress, slightly overweight.  He is alert, oriented,  and cooperative.  Accompanied by his wife.  HEENT:  Normocephalic, atraumatic.  Pupils round and reactive.  Oropharynx  clear, EOMs intact.  NECK:  Supple.  No carotid bruits are appreciated.  CHEST:  Clear anterior/posterior chest walls.  No rhonchi, rales, or  wheezing.  HEART:  Regular rate and rhythm without murmur.  ABDOMEN:  Soft, nontender, bowel sounds present, slightly round.  RECTAL, BREAST, GENITALIA:  Not done, not pertinent to present illness.  EXTREMITIES:  Right knee:  The right knee shows slight varus malalignment  with 5-100 degrees range of motion and moderate crepitus noted.  The left  knee shows range of motion 0-125 with slight crepitus, no effusion.   IMPRESSION:  Bilateral knee osteoarthritis, right more symptomatic than the  left.   PLAN:  The patient will be admitted to [facility] to undergo a  right total knee arthroplasty.  Surgery will be performed by Dr. Ollen Gross.      Alexzandrew L. Julien Girt, P.A.      Ollen Gross, M.D.  Electronically Signed    ALP/MEDQ  D:  08/01/2005  T:  08/03/2005  Job:  [id number]   cc:   Maurice March, M.D.  Fax: [phone]

## 2010-09-18 NOTE — Op Note (Signed)
Colin, Cox NO.:  [id number]   MEDICAL RECORD NO.:  [id number]          PATIENT TYPE:  INP   LOCATION:  1510                         FACILITY:  [facility]   PHYSICIAN:  Ollen Gross, M.D.    DATE OF BIRTH:  01/12/1956   DATE OF PROCEDURE:  07/23/2005  DATE OF DISCHARGE:  07/26/2005                                 OPERATIVE REPORT   PREOPERATIVE DIAGNOSIS:  Osteoarthritis, right knee.   POSTOPERATIVE DIAGNOSIS:  Osteoarthritis, right knee.   PROCEDURE:  Right total knee arthroplasty.   SURGEON:  Dr. Lequita Halt   ASSISTANT:  Avel Peace, PA-C   ANESTHESIA:  General with postop Marcaine pain pump.   ESTIMATED BLOOD LOSS:  Minimal.   DRAIN:  Hemovac x1.   TOURNIQUET TIME:  45 minutes at 300 mmHg.   COMPLICATIONS:  None.   CONDITION:  Stable to recovery.   BRIEF CLINICAL NOTE:  [name] is a 55 year old male with end-stage  osteoarthritis of the right knee with intractable pain.  He has got a large  loose body in the knee.  He has failed nonoperative management, including  injection and presents now for total knee arthroplasty.   PROCEDURE IN DETAIL:  After the successful administration of general  anesthetic, a tourniquet is placed high on the right thigh, and his right  lower extremity is prepped and draped in the usual sterile fashion.  Extremity is wrapped in Esmarch, knee flexed, tourniquet inflated to 300  mmHg.  A midline incision is made with a 10 blade through the subcutaneous  tissue to the level of the extensor mechanism.  A fresh blade is used to  make a medial parapatellar arthrotomy, then the soft tissue over the  proximal and medial tibia is subperiosteally elevated to the joint line with  a knife and into the semimembranosus bursa with a Cobb elevator.  Soft  tissue over the proximal and lateral tibia is also elevated with attention  being paid to avoiding the patellar tendon on tibial tubercle.  The patella  is everted, knee flexed  90-degrees, ACL and PCL removed.  Drill is used to  create a starting hole in the distal femur, and canal is thoroughly  irrigated.  A 5-degree right valgus alignment guide is placed and  referencing off the posterior condyles, rotation is marked and the block  pinned to remove 10 mm off the distal femur.  Distal femoral resection is  made with an oscillating saw.  A sizing block is placed; size 4 is most  appropriate.  The size 4 is then rotated into appropriate position off the  epicondylar axis, and then the size 4 cutting block placed, and the  anterior, posterior, and chamfer cuts are made.   The tibia is subluxed forward, and the menisci are removed.  Extramedullary  tibial alignment guide is placed, referencing proximally at the medial  aspect of the tibial tubercle and distally along the second metatarsal axis  and tibial crest.  The block is pinned to remove 10 mm off the nondeficient  lateral side.  Tibial resection is made with  an oscillating saw.  Size 4 is  the most appropriate tibial component, and the proximal tibia is prepared  with the modular drill and keel punch for a size 4.  Femoral preparation is  completed with the intercondylar cut.   Size 4 mobile bearing tibial trial with the size 4 posterior stabilized  femoral trial and a 10 mm posterior stabilized rotating platform insert  trial are placed.  With the 10, full extension is achieved with excellent  varus and valgus balance throughout full range of motion.  The patella is  then everted and thickness measured to be 24 mm.  Free hand resection is  taken to 14 mm, 38 template is placed; lug holes are drilled; trial patellar  is placed, and it tracks normally.  Osteophytes are then removed off the  posterior femur with the trial in place.  All trials are removed, then the  cut bone surfaces are prepared with pulsatile lavage.  Cement is mixed and  once ready for implantation, the size 4 mobile bearing tibial tray,  size 4  posterior stabilized femur, and 38 patella are cemented into place and  patella is held with clamp.  Trial 10 mm insert is placed, knee held in full  extension, and all extruded cement removed.  Once the cement is fully  hardened, then the permanent 10 mm posterior stabilized rotating platform  insert is placed into the tibial tray.  The wound is copiously irrigated  with saline solution and the extensor mechanism closed over a Hemovac drain  with interrupted #1 PDS.  Flexion against gravity is 135 degrees.  The  tourniquet is then released for a total tourniquet time of 45 minutes.  Subcu is closed with interrupted 2-0 Vicryl, subcuticular with running 4-0  Monocryl.  The incision is cleaned and dried, and Steri-Strips and a bulky  sterile dressing applied.  He is then awakened and transported to recovery  in stable condition.      Ollen Gross, M.D.  Electronically Signed     FA/MEDQ  D:  07/23/2005  T:  07/26/2005  Job:  [id number]

## 2011-01-29 LAB — POCT I-STAT 4, (NA,K, GLUC, HGB,HCT)
Glucose, Bld: 88
HCT: 42
Hemoglobin: 14.3
Potassium: 4.6
Sodium: 139

## 2011-02-01 LAB — URINALYSIS, ROUTINE W REFLEX MICROSCOPIC
Glucose, UA: NEGATIVE
Nitrite: NEGATIVE
Protein, ur: NEGATIVE
Urobilinogen, UA: 1

## 2011-02-01 LAB — CK TOTAL AND CKMB (NOT AT ARMC)
CK, MB: 1.7
Relative Index: 0.8
Total CK: 221

## 2011-02-01 LAB — DIFFERENTIAL
Basophils Absolute: 0
Lymphocytes Relative: 31
Monocytes Absolute: 0.4
Neutro Abs: 2.4

## 2011-02-01 LAB — COMPREHENSIVE METABOLIC PANEL
Albumin: 3.9
BUN: 9
Chloride: 106
Creatinine, Ser: 0.98
Total Bilirubin: 0.6
Total Protein: 6.6

## 2011-02-01 LAB — CBC
Platelets: 253
RDW: 13.6

## 2011-02-05 LAB — URINALYSIS, ROUTINE W REFLEX MICROSCOPIC
Bilirubin Urine: NEGATIVE
Ketones, ur: NEGATIVE mg/dL
Nitrite: NEGATIVE
Protein, ur: NEGATIVE mg/dL

## 2011-02-05 LAB — CBC
HCT: 34.8 % — ABNORMAL LOW (ref 39.0–52.0)
HCT: 41.6 % (ref 39.0–52.0)
Hemoglobin: 10.4 g/dL — ABNORMAL LOW (ref 13.0–17.0)
Hemoglobin: 10.8 g/dL — ABNORMAL LOW (ref 13.0–17.0)
Hemoglobin: 13.9 g/dL (ref 13.0–17.0)
MCHC: 33.3 g/dL (ref 30.0–36.0)
MCHC: 33.4 g/dL (ref 30.0–36.0)
MCV: 85.1 fL (ref 78.0–100.0)
MCV: 85.7 fL (ref 78.0–100.0)
RBC: 3.61 MIL/uL — ABNORMAL LOW (ref 4.22–5.81)
RBC: 4.06 MIL/uL — ABNORMAL LOW (ref 4.22–5.81)
RBC: 4.89 MIL/uL (ref 4.22–5.81)
WBC: 6.7 10*3/uL (ref 4.0–10.5)

## 2011-02-05 LAB — COMPREHENSIVE METABOLIC PANEL
BUN: 11 mg/dL (ref 6–23)
CO2: 32 mEq/L (ref 19–32)
Calcium: 9.2 mg/dL (ref 8.4–10.5)
Creatinine, Ser: 1.06 mg/dL (ref 0.4–1.5)
GFR calc non Af Amer: >60 mL/min (ref >60)
Glucose, Bld: 109 mg/dL — ABNORMAL HIGH (ref 70–99)

## 2011-02-05 LAB — BASIC METABOLIC PANEL
BUN: 10 mg/dL (ref 6–23)
BUN: 7 mg/dL (ref 6–23)
CO2: 30 mEq/L (ref 19–32)
CO2: 33 mEq/L — ABNORMAL HIGH (ref 19–32)
Chloride: 102 mEq/L (ref 96–112)
Creatinine, Ser: 1.09 mg/dL (ref 0.4–1.5)
GFR calc Af Amer: >60 mL/min (ref >60)
GFR calc non Af Amer: >60 mL/min (ref >60)
Glucose, Bld: 131 mg/dL — ABNORMAL HIGH (ref 70–99)
Potassium: 4.3 mEq/L (ref 3.5–5.1)
Potassium: 4.4 mEq/L (ref 3.5–5.1)
Sodium: 137 mEq/L (ref 135–145)

## 2011-02-05 LAB — PROTIME-INR
INR: 1.6 — ABNORMAL HIGH (ref 0.00–1.49)
INR: 1.7 — ABNORMAL HIGH (ref 0.00–1.49)
Prothrombin Time: 12.6 seconds (ref 11.6–15.2)
Prothrombin Time: 13.4 seconds (ref 11.6–15.2)
Prothrombin Time: 19.9 seconds — ABNORMAL HIGH (ref 11.6–15.2)
Prothrombin Time: 24.1 seconds — ABNORMAL HIGH (ref 11.6–15.2)

## 2011-02-05 LAB — APTT: aPTT: 22 seconds — ABNORMAL LOW (ref 24–37)

## 2011-04-08 ENCOUNTER — Emergency Department (HOSPITAL_COMMUNITY)
Admission: EM | Admit: 2011-04-08 | Discharge: 2011-04-08 | Disposition: A | Discharge status: Home / Self Care | Payer: Medicaid Other | Source: Home / Self Care | Attending: Family Medicine | Admitting: Family Medicine

## 2011-04-08 ENCOUNTER — Encounter: Payer: Self-pay | Admitting: Emergency Medicine

## 2011-04-08 DIAGNOSIS — B3742 Candidal balanitis: Secondary | ICD-10-CM

## 2011-04-08 DIAGNOSIS — B3749 Other urogenital candidiasis: Secondary | ICD-10-CM

## 2011-04-08 MED ORDER — TETRACAINE HCL 0.5 % OP SOLN
OPHTHALMIC | Status: AC
  Filled 2011-04-08: qty 2

## 2011-04-08 MED ORDER — CLOTRIMAZOLE-BETAMETHASONE 1-0.05 % EX CREA: TOPICAL_CREAM | CUTANEOUS | Status: DC

## 2011-04-08 NOTE — ED Provider Notes (Signed)
History     CSN: [id number] Arrival date & time: 04/08/2011  7:37 PM   First MD Initiated Contact with Patient 04/08/11 1847      Chief Complaint  Patient presents with  . Rash    (Consider location/radiation/quality/duration/timing/severity/associated sxs/prior treatment) Patient is a 55 y.o. male presenting with rash. The history is provided by the patient.  Rash  This is a new problem. The current episode started more than 2 days ago. The problem has not changed since onset.The problem is associated with an unknown factor. There has been no fever. The rash is present on the genitalia. The patient is experiencing no pain. Associated symptoms include itching. Pertinent negatives include no pain. He has tried nothing for the symptoms.    History reviewed. No pertinent past medical history.  Past Surgical History  Procedure Date  . Replacement total knee     History reviewed. No pertinent family history.  History  Substance Use Topics  . Smoking status: Never Smoker   . Smokeless tobacco: Not on file  . Alcohol Use: Yes      Review of Systems  Constitutional: Negative.   Genitourinary: Negative.  Negative for discharge, penile swelling and penile pain.  Skin: Positive for itching and rash.    Allergies  Review of patient's allergies indicates no known allergies.  Home Medications   Current Outpatient Rx  Name Route Sig Dispense Refill  . CLOTRIMAZOLE-BETAMETHASONE 1-0.05 % EX CREA  Apply to affected area 2 times daily prn 45 g 0    BP 132/82  Pulse 78  Temp(Src) 98 F (36.7 C) (Oral)  Resp 22  SpO2 100%  Physical Exam  Nursing note and vitals reviewed. Constitutional: He appears well-developed and well-nourished.  Genitourinary:     Skin: Rash noted.    ED Course  Procedures (including critical care time)  Labs Reviewed - No data to display No results found.   1. Candidal balanitis       MDM          Barkley Bruns,  MD 04/08/11 2004

## 2011-04-08 NOTE — ED Notes (Signed)
PT HERE WITH PENILE RASH WITH INCREASED ITCHING X 1 WK.PT DENIES BURN OR PENILE D/C.PT HAS BEEN USING VASELINE BUT NO RELIEF.

## 2011-05-01 ENCOUNTER — Emergency Department (INDEPENDENT_AMBULATORY_CARE_PROVIDER_SITE_OTHER)
Admission: EM | Admit: 2011-05-01 | Discharge: 2011-05-01 | Disposition: A | Discharge status: Home / Self Care | Payer: Medicaid Other | Source: Home / Self Care | Attending: Emergency Medicine | Admitting: Emergency Medicine

## 2011-05-01 ENCOUNTER — Encounter (HOSPITAL_COMMUNITY): Payer: Self-pay

## 2011-05-01 DIAGNOSIS — H109 Unspecified conjunctivitis: Secondary | ICD-10-CM

## 2011-05-01 HISTORY — DX: Other seasonal allergic rhinitis: J30.2

## 2011-05-01 HISTORY — DX: Gastro-esophageal reflux disease without esophagitis: K21.9

## 2011-05-01 HISTORY — DX: Other disorders of electrolyte and fluid balance, not elsewhere classified: E87.8

## 2011-05-01 HISTORY — DX: Essential (primary) hypertension: I10

## 2011-05-01 MED ORDER — POLYETHYL GLYCOL-PROPYL GLYCOL 0.4-0.3 % OP SOLN: 1.0000 [drp] | Freq: Four times a day (QID) | OPHTHALMIC | Status: DC | PRN

## 2011-05-01 MED ORDER — TOBRAMYCIN 0.3 % OP SOLN: 1.0000 [drp] | OPHTHALMIC | Status: AC

## 2011-05-01 MED ORDER — OLOPATADINE HCL 0.1 % OP SOLN: 1.0000 [drp] | Freq: Two times a day (BID) | OPHTHALMIC | Status: DC

## 2011-05-01 MED ORDER — TETRACAINE HCL 0.5 % OP SOLN
OPHTHALMIC | Status: AC
  Filled 2011-05-01: qty 2

## 2011-05-01 MED ORDER — TETRACAINE HCL 0.5 % OP SOLN
2.0000 [drp] | Freq: Once | OPHTHALMIC | Status: AC
  Administered 2011-05-01: 2 [drp] via OPHTHALMIC

## 2011-05-01 NOTE — ED Provider Notes (Signed)
History     CSN: [id number]  Arrival date & time 05/01/11  5643   First MD Initiated Contact with Patient 05/01/11 1047      Chief Complaint  Patient presents with  . Eye Drainage    HPI Comments: 3 days of right eye redness, discharge, states is matted shut in am. States eye feels "gritty". Reports itching medial lower eyelid starting today. Some blurry vision R eye. States vision equal in both eyes normally. No swelling, rash, N/V, fevers, photophobia, nasal congestion, pain with EOM's, eye pain. Has problems with seasonal allergies/alleric conjuncitvitis but states allergies under good control recently.  Wears bifocals, no contacts. no known sick contacts.   The history is provided by the patient.    Past Medical History  Diagnosis Date  . Hypertension   . Hyperchloremia   . Acid reflux   . Seasonal allergies     Past Surgical History  Procedure Date  . Replacement total knee   . Lumbar disc surgery   . Gastric resection     History reviewed. No pertinent family history.  History  Substance Use Topics  . Smoking status: Never Smoker   . Smokeless tobacco: Not on file  . Alcohol Use: Yes      Review of Systems  Allergies  Review of patient's allergies indicates no known allergies.  Home Medications   Current Outpatient Rx  Name Route Sig Dispense Refill  . CETIRIZINE HCL 10 MG PO CAPS Oral Take by mouth.      . CYCLOBENZAPRINE HCL 10 MG PO TABS Oral Take 10 mg by mouth 3 (three) times daily as needed.      Marland Kitchen DOXAZOSIN MESYLATE 8 MG PO TABS Oral Take 8 mg by mouth at bedtime.      Marland Kitchen OLMESARTAN MEDOXOMIL 20 MG PO TABS Oral Take 20 mg by mouth daily.      Marland Kitchen PANTOPRAZOLE SODIUM 40 MG PO TBEC Oral Take 40 mg by mouth daily.      Marland Kitchen PRAVASTATIN SODIUM 20 MG PO TABS Oral Take 20 mg by mouth daily.      . TRAMADOL HCL 100 MG PO TB24 Oral Take 100 mg by mouth daily.      Marland Kitchen CLOTRIMAZOLE-BETAMETHASONE 1-0.05 % EX CREA  Apply to affected area 2 times daily prn 45 g 0   . OLOPATADINE HCL 0.1 % OP SOLN Right Eye Place 1 drop into the right eye 2 (two) times daily. 5 mL 0  . POLYETHYL GLYCOL-PROPYL GLYCOL 0.4-0.3 % OP SOLN Ophthalmic Apply 1 drop to eye 4 (four) times daily as needed. 5 mL 0  . TOBRAMYCIN SULFATE 0.3 % OP SOLN Right Eye Place 1 drop into the right eye every 4 (four) hours. X 5 days 5 mL 0    BP 120/74  Pulse 88  Temp(Src) 98.1 F (36.7 C) (Oral)  Resp 19  SpO2 95%  Physical Exam  Constitutional: He is oriented to person, place, and time. He appears well-developed and well-nourished. No distress.  HENT:  Head: Normocephalic and atraumatic.  Nose: Nose normal.  Eyes: EOM are normal. Pupils are equal, round, and reactive to light. No foreign bodies found. Right eye exhibits discharge and exudate. Right eye exhibits no hordeolum. No foreign body present in the right eye. Right conjunctiva is injected. Right conjunctiva has no hemorrhage. No scleral icterus.       Visual Acuity  -  Bilateral   20/40 ;  R  20/70 ;  L :  20/50. Mild chemosis laterally. Mild swelling lower eylid nasally with blocked glands vs early rash. No expressable purulent d/c from tear duct. No surrounding tenderness, no erythema, other rash. No direct or consensual photophobia. No abrasion on flourescin.  Neck: Normal range of motion.  Cardiovascular: Normal rate.   Pulmonary/Chest: Effort normal.  Abdominal: He exhibits no distension.  Neurological: He is alert and oriented to person, place, and time.  Skin: Skin is warm and dry. No rash noted.  Psychiatric: He has a normal mood and affect. His behavior is normal. Judgment and thought content normal.    ED Course  Procedures (including critical care time)  Labs Reviewed - No data to display No results found.   1. Conjunctivitis, right eye       MDM  Vis acuity worse in R eye which is new per pt. Unable to tell if has blocked glands or if this is early shingles medially. No evidence of zoster, abrasion,  keratitis, iritis at this time. Will tx as infective conjuncitvitis with tobrex, systane for comfort. patanol as has h/o allergic conjunctivitis & have f/u with optho in 2 days. No steriods at this time as is uncertain if this is early shingles.    Luiz Blare, MD 05/01/11 1409

## 2011-05-01 NOTE — ED Notes (Signed)
Pt has itching of rt eye, burning and states it feels "gritty" for four days

## 2011-07-31 ENCOUNTER — Emergency Department (HOSPITAL_COMMUNITY)
Admission: EM | Admit: 2011-07-31 | Discharge: 2011-07-31 | Disposition: A | Discharge status: Home / Self Care | Payer: Medicaid Other | Attending: Emergency Medicine | Admitting: Emergency Medicine

## 2011-07-31 ENCOUNTER — Encounter (HOSPITAL_COMMUNITY): Payer: Self-pay | Admitting: *Deleted

## 2011-07-31 DIAGNOSIS — E7800 Pure hypercholesterolemia, unspecified: Secondary | ICD-10-CM | POA: Insufficient documentation

## 2011-07-31 DIAGNOSIS — Z87891 Personal history of nicotine dependence: Secondary | ICD-10-CM | POA: Insufficient documentation

## 2011-07-31 DIAGNOSIS — K219 Gastro-esophageal reflux disease without esophagitis: Secondary | ICD-10-CM | POA: Insufficient documentation

## 2011-07-31 DIAGNOSIS — I10 Essential (primary) hypertension: Secondary | ICD-10-CM | POA: Insufficient documentation

## 2011-07-31 DIAGNOSIS — M549 Dorsalgia, unspecified: Secondary | ICD-10-CM

## 2011-07-31 MED ORDER — OXYCODONE-ACETAMINOPHEN 5-325 MG PO TABS: 1.0000 | ORAL_TABLET | Freq: Four times a day (QID) | ORAL | Status: AC | PRN

## 2011-07-31 MED ORDER — OXYCODONE-ACETAMINOPHEN 5-325 MG PO TABS
1.0000 | ORAL_TABLET | Freq: Once | ORAL | Status: AC
  Administered 2011-07-31: 1 via ORAL
  Filled 2011-07-31: qty 1

## 2011-07-31 NOTE — Discharge Instructions (Signed)
Followup with the orthopedic doctor on Tuesday as scheduled. Continue take the Flexeril and tramadol supplement with Percocet as needed for stronger pain. Return for any bilateral foot weakness or numbness or incontinence.

## 2011-07-31 NOTE — ED Provider Notes (Signed)
History     CSN: [id number]  Arrival date & time 07/31/11  0708   First MD Initiated Contact with Patient 07/31/11 [phone]      Chief Complaint  Patient presents with  . Back Pain    starting x 5 days    (Consider location/radiation/quality/duration/timing/severity/associated sxs/prior treatment) Patient is a 56 y.o. male presenting with back pain. The history is provided by the patient.  Back Pain  This is a recurrent problem. The current episode started more than 2 days ago. The problem occurs constantly. The problem has been gradually worsening. The pain is associated with no known injury. The pain is present in the lumbar spine. The quality of the pain is described as aching and shooting. The pain radiates to the left foot and right foot. The pain is at a severity of 10/10. The pain is moderate. The symptoms are aggravated by certain positions. Associated symptoms include leg pain. Pertinent negatives include no chest pain, no fever, no numbness, no headaches, no abdominal pain, no abdominal swelling, no bowel incontinence, no perianal numbness, no bladder incontinence, no dysuria, no paresthesias, no paresis, no tingling and no weakness.   Patient with history of chronic back pain recently had surgery in January 2012 patient with bilateral leg pain and low back pain starting 5 days ago no numbness or weakness to the lower extremity. Patient started on tramadol and Ultram by primary care provider. This is not helping to control the pain adequately. Patient has followup with orthopedic surgeon on Tuesday.  Past Medical History  Diagnosis Date  . Hypertension   . Hyperchloremia   . Acid reflux   . Seasonal allergies     Past Surgical History  Procedure Date  . Replacement total knee   . Lumbar disc surgery   . Gastric resection     History reviewed. No pertinent family history.  History  Substance Use Topics  . Smoking status: Former Smoker    Quit date: 07/30/1997  .  Smokeless tobacco: Never Used  . Alcohol Use: Yes      Review of Systems  Constitutional: Negative for fever.  HENT: Negative for neck pain.   Respiratory: Negative for chest tightness and shortness of breath.   Cardiovascular: Negative for chest pain.  Gastrointestinal: Negative for abdominal pain and bowel incontinence.  Genitourinary: Negative for bladder incontinence, dysuria and flank pain.  Musculoskeletal: Positive for back pain.  Skin: Negative for rash.  Neurological: Negative for tingling, facial asymmetry, weakness, numbness, headaches and paresthesias.    Allergies  Review of patient's allergies indicates no known allergies.  Home Medications   Current Outpatient Rx  Name Route Sig Dispense Refill  . CETIRIZINE HCL 10 MG PO CAPS Oral Take 10 mg by mouth daily.     . CYCLOBENZAPRINE HCL 10 MG PO TABS Oral Take 10 mg by mouth 3 (three) times daily as needed.      Marland Kitchen DOXAZOSIN MESYLATE 8 MG PO TABS Oral Take 8 mg by mouth at bedtime.      Marland Kitchen OLMESARTAN MEDOXOMIL 20 MG PO TABS Oral Take 20 mg by mouth daily.      Marland Kitchen PANTOPRAZOLE SODIUM 40 MG PO TBEC Oral Take 40 mg by mouth daily.      Marland Kitchen PRAVASTATIN SODIUM 20 MG PO TABS Oral Take 20 mg by mouth daily.      . TRAMADOL HCL ER 100 MG PO TB24 Oral Take 100 mg by mouth daily.      Marland Kitchen VITAMIN D (  ERGOCALCIFEROL) 50000 UNITS PO CAPS Oral Take 50,000 Units by mouth every 7 (seven) days. On sundays    . OXYCODONE-ACETAMINOPHEN 5-325 MG PO TABS Oral Take 1-2 tablets by mouth every 6 (six) hours as needed for pain. 14 tablet 0    BP 130/77  Pulse 81  Temp(Src) 98.1 F (36.7 C) (Oral)  Resp 22  SpO2 98%  Physical Exam  Nursing note and vitals reviewed. Constitutional: He is oriented to person, place, and time. He appears well-developed and well-nourished. No distress.  HENT:  Head: Normocephalic and atraumatic.  Mouth/Throat: Oropharynx is clear and moist.  Eyes: Conjunctivae and EOM are normal. Pupils are equal, round, and  reactive to light.  Neck: Normal range of motion. Neck supple.  Cardiovascular: Normal rate, regular rhythm, normal heart sounds and intact distal pulses.   No murmur heard. Pulmonary/Chest: Effort normal and breath sounds normal. No respiratory distress.  Abdominal: Soft. Bowel sounds are normal. There is no tenderness.  Musculoskeletal: Normal range of motion. He exhibits no edema and no tenderness.       Lumbar paraspinous tenderness no muscle spasm  Neurological: He is alert and oriented to person, place, and time. No cranial nerve deficit. He exhibits normal muscle tone. Coordination normal.  Skin: Skin is warm. No rash noted.    ED Course  Procedures (including critical care time)  Labs Reviewed - No data to display No results found.   1. Back pain       MDM   Patient with five-day history of low back pain has had the chronic back pain issues since surgery in January of 2012. Patient states this pain radiates to both legs but has no neuro deficits to the lower extremities no numbness no weakness also no incontinence. Patient has followup with orthopedic doctor on Tuesday. Patient is currently taking Flexeril and tramadol prescribed by his primary care provider however is not controlling the pain adequately. Will supplement with Percocet patient given precautions on when to return.        Shelda Jakes, MD 07/31/11 [phone]

## 2011-07-31 NOTE — ED Notes (Signed)
Pt from home with reports of back pain x 5 days, pt with hx of chronic back pain issues with back surgery in January 2012, but pt reports that back "went out on me". Pt denies new injury or fall.

## 2011-07-31 NOTE — ED Notes (Signed)
Pt c/o low back pain x 5 days, has appt w/ ortho Dr. Jillyn Hidden next week. Pt has hx of back pain w/ surgery. Pt has been taking tramadol and flexeril w/o relief. Pt denies incontinence.

## 2012-01-03 ENCOUNTER — Emergency Department (HOSPITAL_COMMUNITY)
Admission: EM | Admit: 2012-01-03 | Discharge: 2012-01-03 | Disposition: A | Discharge status: Home / Self Care | Payer: Medicaid Other | Attending: Emergency Medicine | Admitting: Emergency Medicine

## 2012-01-03 ENCOUNTER — Emergency Department (HOSPITAL_COMMUNITY): Payer: Medicaid Other

## 2012-01-03 ENCOUNTER — Encounter (HOSPITAL_COMMUNITY): Payer: Self-pay | Admitting: Nurse Practitioner

## 2012-01-03 DIAGNOSIS — X500XXA Overexertion from strenuous movement or load, initial encounter: Secondary | ICD-10-CM | POA: Insufficient documentation

## 2012-01-03 DIAGNOSIS — I10 Essential (primary) hypertension: Secondary | ICD-10-CM | POA: Insufficient documentation

## 2012-01-03 DIAGNOSIS — E049 Nontoxic goiter, unspecified: Secondary | ICD-10-CM | POA: Insufficient documentation

## 2012-01-03 DIAGNOSIS — Z79899 Other long term (current) drug therapy: Secondary | ICD-10-CM | POA: Insufficient documentation

## 2012-01-03 DIAGNOSIS — S2341XA Sprain of ribs, initial encounter: Secondary | ICD-10-CM | POA: Insufficient documentation

## 2012-01-03 DIAGNOSIS — IMO0002 Reserved for concepts with insufficient information to code with codable children: Secondary | ICD-10-CM

## 2012-01-03 DIAGNOSIS — R079 Chest pain, unspecified: Secondary | ICD-10-CM | POA: Insufficient documentation

## 2012-01-03 DIAGNOSIS — D3500 Benign neoplasm of unspecified adrenal gland: Secondary | ICD-10-CM | POA: Insufficient documentation

## 2012-01-03 HISTORY — DX: Unspecified asthma, uncomplicated: J45.909

## 2012-01-03 HISTORY — DX: Hyperlipidemia, unspecified: E78.5

## 2012-01-03 HISTORY — DX: Restless legs syndrome: G25.81

## 2012-01-03 LAB — POCT I-STAT, CHEM 8
Chloride: 104 mEq/L (ref 96–112)
HCT: 44 % (ref 39.0–52.0)
Potassium: 4 mEq/L (ref 3.5–5.1)

## 2012-01-03 MED ORDER — ONDANSETRON HCL 4 MG/2ML IJ SOLN
4.0000 mg | Freq: Once | INTRAMUSCULAR | Status: AC
  Administered 2012-01-03: 4 mg via INTRAVENOUS
  Filled 2012-01-03: qty 2

## 2012-01-03 MED ORDER — OXYCODONE-ACETAMINOPHEN 5-325 MG PO TABS: 2.0000 | ORAL_TABLET | ORAL | Status: AC | PRN

## 2012-01-03 MED ORDER — IOHEXOL 350 MG/ML SOLN
100.0000 mL | Freq: Once | INTRAVENOUS | Status: AC | PRN
  Administered 2012-01-03: 100 mL via INTRAVENOUS

## 2012-01-03 MED ORDER — HYDROMORPHONE HCL PF 1 MG/ML IJ SOLN
1.0000 mg | Freq: Once | INTRAMUSCULAR | Status: AC
  Administered 2012-01-03: 1 mg via INTRAVENOUS
  Filled 2012-01-03: qty 1

## 2012-01-03 NOTE — ED Notes (Signed)
Pt provided with incentive spirometer. Verbalized understanding. Pt ambulated to discharge independently

## 2012-01-03 NOTE — ED Notes (Signed)
Pt. Presents with left lower rib pain. Pt states he has had this constant pain for the last 2 days. Today pt. Sneezed and felt "a bubble pop" inside on left lower rib area. Pt. Is SHOB, and has pain with deep inhalation. 97%/RA

## 2012-01-03 NOTE — ED Provider Notes (Signed)
History     CSN: [id number]  Arrival date & time 01/03/12  1024   First MD Initiated Contact with Patient 01/03/12 1039      Chief Complaint  Patient presents with  . Chest Pain    (Consider location/radiation/quality/duration/timing/severity/associated sxs/prior treatment) HPI Comments: Presents with pain to his left lower ribs for the last 2 days. He states that it started after a sneezing episode. It started 2 days ago and has been constant since then. It's worse when he moves, worse when he sneezes, worse when he takes a deep breath. He denies any shortness of breath although he does have some pain with inspiration. He denies any cough or chest congestion. He does have some ongoing sneezing and allergy type symptoms. He denies any leg pain or swelling. Denies any recent long trips other than over July 4 holiday weekend. Denies any history of venous thromboembolus in the past. Denies any recent trips. Denies any nausea or vomiting. Denies abdominal pain.  Patient is a 56 y.o. male presenting with chest pain. The history is provided by the patient.  Chest Pain Pertinent negatives for primary symptoms include no fever, no fatigue, no shortness of breath, no cough, no abdominal pain, no nausea, no vomiting and no dizziness.  Pertinent negatives for associated symptoms include no diaphoresis, no numbness and no weakness.     Past Medical History  Diagnosis Date  . Hypertension   . Hyperchloremia   . Acid reflux   . Seasonal allergies   . Asthma   . Hyperlipidemia   . Restless leg syndrome     Past Surgical History  Procedure Date  . Replacement total knee   . Lumbar disc surgery   . Gastric resection     History reviewed. No pertinent family history.  History  Substance Use Topics  . Smoking status: Former Smoker    Quit date: 07/30/1997  . Smokeless tobacco: Never Used  . Alcohol Use: Yes      Review of Systems  Constitutional: Negative for fever, chills,  diaphoresis and fatigue.  HENT: Negative for congestion, rhinorrhea and sneezing.   Eyes: Negative.   Respiratory: Negative for cough, chest tightness and shortness of breath.   Cardiovascular: Positive for chest pain. Negative for leg swelling.  Gastrointestinal: Negative for nausea, vomiting, abdominal pain, diarrhea and blood in stool.  Genitourinary: Negative for frequency, hematuria, flank pain and difficulty urinating.  Musculoskeletal: Negative for back pain and arthralgias.  Skin: Negative for rash.  Neurological: Negative for dizziness, speech difficulty, weakness, numbness and headaches.    Allergies  Review of patient's allergies indicates no known allergies.  Home Medications   Current Outpatient Rx  Name Route Sig Dispense Refill  . CETIRIZINE HCL 10 MG PO CAPS Oral Take 10 mg by mouth daily.     . CYCLOBENZAPRINE HCL 10 MG PO TABS Oral Take 10 mg by mouth 3 (three) times daily as needed.      Marland Kitchen DOXAZOSIN MESYLATE 8 MG PO TABS Oral Take 8 mg by mouth at bedtime.      Marland Kitchen OLMESARTAN MEDOXOMIL 20 MG PO TABS Oral Take 20 mg by mouth daily.      Marland Kitchen PRAMIPEXOLE DIHYDROCHLORIDE 0.25 MG PO TABS Oral Take 0.25 mg by mouth daily.    Marland Kitchen PRAVASTATIN SODIUM 20 MG PO TABS Oral Take 20 mg by mouth daily.      . TRAMADOL HCL ER 100 MG PO TB24 Oral Take 100 mg by mouth daily.      Marland Kitchen  VITAMIN D (ERGOCALCIFEROL) 50000 UNITS PO CAPS Oral Take 50,000 Units by mouth every 7 (seven) days. On sundays    . OXYCODONE-ACETAMINOPHEN 5-325 MG PO TABS Oral Take 2 tablets by mouth every 4 (four) hours as needed for pain. 15 tablet 0    BP 129/95  Pulse 70  Temp 97.8 F (36.6 C) (Oral)  Resp 18  SpO2 97%  Physical Exam  Constitutional: He is oriented to person, place, and time. He appears well-developed and well-nourished.  HENT:  Head: Normocephalic and atraumatic.  Eyes: Pupils are equal, round, and reactive to light.  Neck: Normal range of motion. Neck supple.  Cardiovascular: Normal rate,  regular rhythm and normal heart sounds.   Pulmonary/Chest: Effort normal and breath sounds normal. No respiratory distress. He has no wheezes. He has no rales. He exhibits tenderness (moderate tenderness to left lower ribs.  no crepitus or deformity).  Abdominal: Soft. Bowel sounds are normal. There is no tenderness (no pain over spleen). There is no rebound and no guarding.  Musculoskeletal: Normal range of motion. He exhibits no edema.  Lymphadenopathy:    He has no cervical adenopathy.  Neurological: He is alert and oriented to person, place, and time.  Skin: Skin is warm and dry. No rash noted.  Psychiatric: He has a normal mood and affect.    ED Course  Procedures (including critical care time) Results for orders placed during the hospital encounter of 01/03/12  POCT I-STAT, CHEM 8      Component Value Range   Sodium 142  135 - 145 mEq/L   Potassium 4.0  3.5 - 5.1 mEq/L   Chloride 104  96 - 112 mEq/L   BUN 13  6 - 23 mg/dL   Creatinine, Ser 9.60  0.50 - 1.35 mg/dL   Glucose, Bld 94  70 - 99 mg/dL   Calcium, Ion 4.54 (*) 1.12 - 1.23 mmol/L   TCO2 26  0 - 100 mmol/L   Hemoglobin 15.0  13.0 - 17.0 g/dL   HCT 09.8  11.9 - 14.7 %   Dg Ribs Unilateral W/chest Left  01/03/2012  *RADIOLOGY REPORT*  Clinical Data: Anterior left rib pain.  No known injuries.  LEFT RIBS AND CHEST - 3+ VIEW  Comparison: No prior rib imaging.  Two-view chest x-ray 05/27/2009, 04/11/2008, 10/19/2004.  Bone window images from CT chest 02/03/2004.  Findings: No fractures identified involving the left ribs.  No intrinsic osseous abnormalities.  Prominent costal cartilage calcifications.  Suboptimal inspiration which accounts for atelectasis at the bases and accentuates the cardiac silhouette.  Stable calcified granuloma in the left lung base.  Lungs otherwise clear.  Cardiac silhouette upper normal in size for technique and degree of inspiration.  IMPRESSION:  1.  No left rib fractures identified. 2.  Suboptimal  inspiration accounts for bibasilar atelectasis. Calcified granuloma the left lung base.  No acute cardiopulmonary disease otherwise.   Original Report Authenticated By: Arnell Sieving, M.D.    Ct Angio Chest Pe W/cm &/or Wo Cm  01/03/2012  *RADIOLOGY REPORT*  Clinical Data: Left-sided chest pain.  History of hypertension and asthma.  CT ANGIOGRAPHY CHEST  Technique:  Multidetector CT imaging of the chest using the standard protocol during bolus administration of intravenous contrast. Multiplanar reconstructed images including MIPs were obtained and reviewed to evaluate the vascular anatomy.  Contrast: OMNIPAQUE IOHEXOL 350 MG/ML.  Comparison: Unenhanced CT chest 02/03/2004.  Findings: Contrast opacification of pulmonary arteries is moderate to good.  No filling defects  within either main pulmonary artery or their proximal branches in either lung.  Heart mildly enlarged.  No visible coronary artery calcification.  Small pericardial effusion in the superior recess and laterally on the right at the base.  No visible atherosclerosis involving the thoracic or upper abdominal aorta or their visualized branches.  Calcified granuloma in the posterior left lower lobe, unchanged. Mild dependent atelectasis in the posterior lower lobes.  Stable minimal scarring in the lingula and right middle lobe.  No localized airspace consolidation.  No evidence of interstitial lung disease.  No noncalcified pulmonary nodules.  No pleural effusions.  No significant mediastinal, hilar, or axillary lymphadenopathy. Diffuse thyroid gland enlargement without nodularity, unchanged. 2.8 x 2.5 cm low attenuation right adrenal nodule, unchanged. Simple cyst arising from the posterior upper pole of the left kidney.  Visualized upper abdomen otherwise unremarkable.  Bone window images unremarkable.  IMPRESSION:  1.  No evidence of pulmonary embolism. 2.  Borderline to mild cardiomegaly.  Expected mild dependent atelectasis posteriorly in  the lower lobes.  No acute cardiopulmonary disease. 3.  Calcified granuloma in the posterior left lower lobe. 4.  Thyroid goiter without nodularity. 5.  Stable right adrenal adenoma.   Original Report Authenticated By: Arnell Sieving, M.D.        1. Sprain and strain of ribs       MDM  Pt with no definite rib fracture. No PE.  No PTX.  Pain is reproducible along ribs.  No abd tenderness to suggest abdominal etiology.  Will d/c with pain meds.  F/u with his PMD        Rolan Bucco, MD 01/03/12 [phone]

## 2013-01-02 ENCOUNTER — Emergency Department (HOSPITAL_COMMUNITY): Payer: Medicaid Other

## 2013-01-02 ENCOUNTER — Observation Stay (HOSPITAL_COMMUNITY)
Admission: EM | Admit: 2013-01-02 | Discharge: 2013-01-03 | Disposition: A | Discharge status: Home / Self Care | Payer: Medicaid Other | Attending: Internal Medicine | Admitting: Internal Medicine

## 2013-01-02 ENCOUNTER — Encounter (HOSPITAL_COMMUNITY): Payer: Self-pay | Admitting: *Deleted

## 2013-01-02 DIAGNOSIS — I10 Essential (primary) hypertension: Secondary | ICD-10-CM | POA: Insufficient documentation

## 2013-01-02 DIAGNOSIS — E669 Obesity, unspecified: Secondary | ICD-10-CM | POA: Insufficient documentation

## 2013-01-02 DIAGNOSIS — R7309 Other abnormal glucose: Secondary | ICD-10-CM | POA: Insufficient documentation

## 2013-01-02 DIAGNOSIS — I517 Cardiomegaly: Secondary | ICD-10-CM

## 2013-01-02 DIAGNOSIS — E785 Hyperlipidemia, unspecified: Secondary | ICD-10-CM | POA: Insufficient documentation

## 2013-01-02 DIAGNOSIS — R42 Dizziness and giddiness: Principal | ICD-10-CM | POA: Insufficient documentation

## 2013-01-02 DIAGNOSIS — Z87898 Personal history of other specified conditions: Secondary | ICD-10-CM

## 2013-01-02 DIAGNOSIS — Z87891 Personal history of nicotine dependence: Secondary | ICD-10-CM | POA: Insufficient documentation

## 2013-01-02 DIAGNOSIS — F329 Major depressive disorder, single episode, unspecified: Secondary | ICD-10-CM

## 2013-01-02 DIAGNOSIS — R55 Syncope and collapse: Secondary | ICD-10-CM | POA: Insufficient documentation

## 2013-01-02 DIAGNOSIS — K279 Peptic ulcer, site unspecified, unspecified as acute or chronic, without hemorrhage or perforation: Secondary | ICD-10-CM

## 2013-01-02 DIAGNOSIS — M1710 Unilateral primary osteoarthritis, unspecified knee: Secondary | ICD-10-CM

## 2013-01-02 DIAGNOSIS — M519 Unspecified thoracic, thoracolumbar and lumbosacral intervertebral disc disorder: Secondary | ICD-10-CM | POA: Insufficient documentation

## 2013-01-02 DIAGNOSIS — M5137 Other intervertebral disc degeneration, lumbosacral region: Secondary | ICD-10-CM | POA: Diagnosis present

## 2013-01-02 DIAGNOSIS — Z79899 Other long term (current) drug therapy: Secondary | ICD-10-CM | POA: Insufficient documentation

## 2013-01-02 DIAGNOSIS — J309 Allergic rhinitis, unspecified: Secondary | ICD-10-CM

## 2013-01-02 DIAGNOSIS — E7800 Pure hypercholesterolemia, unspecified: Secondary | ICD-10-CM | POA: Insufficient documentation

## 2013-01-02 DIAGNOSIS — F411 Generalized anxiety disorder: Secondary | ICD-10-CM | POA: Insufficient documentation

## 2013-01-02 LAB — POCT I-STAT TROPONIN I

## 2013-01-02 LAB — BASIC METABOLIC PANEL
BUN: 17 mg/dL (ref 6–23)
CO2: 30 mEq/L (ref 19–32)
Chloride: 104 mEq/L (ref 96–112)
GFR calc Af Amer: 86 mL/min — ABNORMAL LOW (ref >90)
Glucose, Bld: 111 mg/dL — ABNORMAL HIGH (ref 70–99)
Potassium: 3.9 mEq/L (ref 3.5–5.1)

## 2013-01-02 LAB — TROPONIN I
Troponin I: <0.3 ng/mL (ref <0.30)
Troponin I: <0.3 ng/mL (ref <0.30)

## 2013-01-02 LAB — CBC
HCT: 38.5 % — ABNORMAL LOW (ref 39.0–52.0)
Hemoglobin: 12.9 g/dL — ABNORMAL LOW (ref 13.0–17.0)
RBC: 4.65 MIL/uL (ref 4.22–5.81)
WBC: 5 10*3/uL (ref 4.0–10.5)

## 2013-01-02 LAB — HEMOGLOBIN A1C: Hgb A1c MFr Bld: 6.4 % — ABNORMAL HIGH (ref <5.7)

## 2013-01-02 MED ORDER — DOXAZOSIN MESYLATE 8 MG PO TABS
8.0000 mg | ORAL_TABLET | Freq: Every day | ORAL | Status: DC
  Administered 2013-01-02: 8 mg via ORAL
  Filled 2013-01-02 (×2): qty 1

## 2013-01-02 MED ORDER — ONDANSETRON HCL 4 MG/2ML IJ SOLN: 4.0000 mg | Freq: Four times a day (QID) | INTRAMUSCULAR | Status: DC | PRN

## 2013-01-02 MED ORDER — LORATADINE 10 MG PO TABS
10.0000 mg | ORAL_TABLET | Freq: Every day | ORAL | Status: DC
  Administered 2013-01-02 – 2013-01-03 (×2): 10 mg via ORAL
  Filled 2013-01-02 (×3): qty 1

## 2013-01-02 MED ORDER — SODIUM CHLORIDE 0.9 % IV BOLUS (SEPSIS)
1000.0000 mL | Freq: Once | INTRAVENOUS | Status: AC
  Administered 2013-01-02: 1000 mL via INTRAVENOUS

## 2013-01-02 MED ORDER — PRAMIPEXOLE DIHYDROCHLORIDE 0.25 MG PO TABS
0.2500 mg | ORAL_TABLET | Freq: Every day | ORAL | Status: DC
  Administered 2013-01-02 – 2013-01-03 (×2): 0.25 mg via ORAL
  Filled 2013-01-02 (×3): qty 1

## 2013-01-02 MED ORDER — IOHEXOL 350 MG/ML SOLN
100.0000 mL | Freq: Once | INTRAVENOUS | Status: AC | PRN
  Administered 2013-01-02: 100 mL via INTRAVENOUS

## 2013-01-02 MED ORDER — PANTOPRAZOLE SODIUM 40 MG PO TBEC
40.0000 mg | DELAYED_RELEASE_TABLET | Freq: Every day | ORAL | Status: DC
  Administered 2013-01-02 – 2013-01-03 (×2): 40 mg via ORAL
  Filled 2013-01-02 (×2): qty 1

## 2013-01-02 MED ORDER — IRBESARTAN 75 MG PO TABS
75.0000 mg | ORAL_TABLET | Freq: Every day | ORAL | Status: DC
  Administered 2013-01-02 – 2013-01-03 (×2): 75 mg via ORAL
  Filled 2013-01-02 (×2): qty 1

## 2013-01-02 MED ORDER — ALUM & MAG HYDROXIDE-SIMETH 200-200-20 MG/5ML PO SUSP: 15.0000 mL | Freq: Four times a day (QID) | ORAL | Status: DC | PRN

## 2013-01-02 MED ORDER — ENOXAPARIN SODIUM 60 MG/0.6ML ~~LOC~~ SOLN
60.0000 mg | SUBCUTANEOUS | Status: DC
  Administered 2013-01-02: 22:00:00 60 mg via SUBCUTANEOUS
  Filled 2013-01-02 (×2): qty 0.6

## 2013-01-02 MED ORDER — ACETAMINOPHEN 325 MG PO TABS
650.0000 mg | ORAL_TABLET | Freq: Four times a day (QID) | ORAL | Status: DC | PRN
  Administered 2013-01-02 (×2): 650 mg via ORAL
  Filled 2013-01-02 (×3): qty 2

## 2013-01-02 MED ORDER — ACETAMINOPHEN 325 MG PO TABS: 650.0000 mg | ORAL_TABLET | Freq: Four times a day (QID) | ORAL | Status: DC | PRN

## 2013-01-02 MED ORDER — CYCLOBENZAPRINE HCL 10 MG PO TABS
10.0000 mg | ORAL_TABLET | Freq: Three times a day (TID) | ORAL | Status: DC | PRN
  Filled 2013-01-02: qty 1

## 2013-01-02 MED ORDER — SIMVASTATIN 10 MG PO TABS
10.0000 mg | ORAL_TABLET | Freq: Every day | ORAL | Status: DC
  Administered 2013-01-02: 18:00:00 10 mg via ORAL
  Filled 2013-01-02 (×2): qty 1

## 2013-01-02 NOTE — ED Notes (Signed)
Pt c/o two wks waking up with c/o dizziness; sweats; headache; pt has appt with pcp tomorrow; saw urologist this past wk bp elevated

## 2013-01-02 NOTE — ED Notes (Signed)
Patient decided he wants to stay in hospital to be evaluated.

## 2013-01-02 NOTE — ED Provider Notes (Signed)
CSN: [id number]     Arrival date & time 01/02/13  4098 History   First MD Initiated Contact with Patient 01/02/13 [phone]     Chief Complaint  Patient presents with  . Headache   (Consider location/radiation/quality/duration/timing/severity/associated sxs/prior Treatment) HPI Comments: 13 show male the past medical history of hypertension, asthma causing, hyperlipidemia, acid reflux and sleep apnea presents to the emergency department with his wife complaining of sudden onset dizziness beginning about an hour and half prior to arrival around 5:00 this morning. Patient was already awake when symptoms began. He began breaking out in sweats and having shortness of breath. Dizziness has been constant since, described more as a lightheaded feeling. Wife checked his sugar which was 114. He has not yet taken his blood pressure medication today. Admits to associated slight blurred vision since onset of symptoms. Wife denies any changes in speech or facial asymmetry. Patient states he's had a headache over the past 2 days when he wakes up in the morning, takes naproxen and headache shortly subsides. Denies chest pain, nausea, vomiting, fever or chills. He is a nonsmoker. No family history of early heart disease.  Patient is a 57 y.o. male presenting with headaches. The history is provided by the patient and the spouse.  Headache Associated symptoms: dizziness   Associated symptoms: no back pain, no fever, no nausea and no vomiting     Past Medical History  Diagnosis Date  . Hypertension   . Hyperchloremia   . Acid reflux   . Seasonal allergies   . Asthma   . Hyperlipidemia   . Restless leg syndrome    Past Surgical History  Procedure Laterality Date  . Replacement total knee    . Lumbar disc surgery    . Gastric resection     No family history on file. History  Substance Use Topics  . Smoking status: Former Smoker    Quit date: 07/30/1997  . Smokeless tobacco: Never Used  . Alcohol Use:  Yes    Review of Systems  Constitutional: Positive for diaphoresis. Negative for fever and chills.  Eyes: Positive for visual disturbance.  Respiratory: Positive for shortness of breath.   Cardiovascular: Negative for chest pain.  Gastrointestinal: Negative for nausea and vomiting.  Musculoskeletal: Negative for back pain.  Neurological: Positive for dizziness, light-headedness and headaches. Negative for syncope, speech difficulty and weakness.  Psychiatric/Behavioral: Negative for confusion.  All other systems reviewed and are negative.    Allergies  Review of patient's allergies indicates no known allergies.  Home Medications   Current Outpatient Rx  Name  Route  Sig  Dispense  Refill  . Cetirizine HCl 10 MG CAPS   Oral   Take 10 mg by mouth daily.          . cyclobenzaprine (FLEXERIL) 10 MG tablet   Oral   Take 10 mg by mouth 3 (three) times daily as needed.           . doxazosin (CARDURA) 8 MG tablet   Oral   Take 8 mg by mouth at bedtime.           Marland Kitchen olmesartan (BENICAR) 20 MG tablet   Oral   Take 20 mg by mouth daily.           . pramipexole (MIRAPEX) 0.25 MG tablet   Oral   Take 0.25 mg by mouth daily.         . pravastatin (PRAVACHOL) 20 MG tablet   Oral  Take 20 mg by mouth daily.           . traMADol (ULTRAM-ER) 100 MG 24 hr tablet   Oral   Take 100 mg by mouth daily.           . Vitamin D, Ergocalciferol, (DRISDOL) 50000 UNITS CAPS   Oral   Take 50,000 Units by mouth every 7 (seven) days. On sundays          BP 153/93  Pulse 103  Temp(Src) 97.8 F (36.6 C)  Resp 24  SpO2 97% Physical Exam  Nursing note and vitals reviewed. Constitutional: He is oriented to person, place, and time. He appears well-developed and well-nourished. No distress.  Obese  HENT:  Head: Normocephalic and atraumatic.  Mouth/Throat: Oropharynx is clear and moist.  Eyes: Conjunctivae and EOM are normal. Pupils are equal, round, and reactive to light.   Neck: Normal range of motion. Neck supple. No JVD present.  Cardiovascular: Regular rhythm, normal heart sounds and intact distal pulses.  Tachycardia present.   No extremity edema.  Pulmonary/Chest: Breath sounds normal. Tachypnea noted. No respiratory distress. He has no decreased breath sounds. He has no wheezes. He has no rhonchi. He has no rales.  Abdominal: Soft. Bowel sounds are normal. He exhibits no distension. There is no tenderness.  Musculoskeletal: Normal range of motion. He exhibits no edema.  Neurological: He is alert and oriented to person, place, and time. He has normal strength. No cranial nerve deficit or sensory deficit. He displays a negative Romberg sign. Coordination normal.  Skin: Skin is warm. He is diaphoretic.  Psychiatric: He has a normal mood and affect. His behavior is normal.    ED Course  Procedures (including critical care time) Labs Review Labs Reviewed  CBC - Abnormal; Notable for the following:    Hemoglobin 12.9 (*)    HCT 38.5 (*)    All other components within normal limits  BASIC METABOLIC PANEL - Abnormal; Notable for the following:    Glucose, Bld 111 (*)    GFR calc non Af Amer 74 (*)    GFR calc Af Amer 86 (*)    All other components within normal limits  D-DIMER, QUANTITATIVE - Abnormal; Notable for the following:    D-Dimer, Quant 0.50 (*)    All other components within normal limits  TROPONIN I  TROPONIN I  TROPONIN I  POCT I-STAT TROPONIN I    Date: 01/02/2013  Rate: 78  Rhythm: normal sinus rhythm  QRS Axis: normal  Intervals: normal  ST/T Wave abnormalities: normal  Conduction Disutrbances:none  Narrative Interpretation: normal EKG, no stemi  Old EKG Reviewed: none available   Imaging Review Dg Chest 2 View  01/02/2013   *RADIOLOGY REPORT*  Clinical Data: Shortness of breath  CHEST - 2 VIEW  Comparison: CTA chest dated 01/03/2012  Findings: Calcified granuloma in the left lower lobe. Lungs are otherwise clear.  No pleural  effusion or pneumothorax.  The heart is normal in size.  Mild degenerative changes of the visualized thoracolumbar spine.  IMPRESSION: No evidence of acute cardiopulmonary disease.   Original Report Authenticated By: Charline Bills, M.D.   Ct Angio Chest Pe W/cm &/or Wo Cm  01/02/2013   *RADIOLOGY REPORT*  Clinical Data: SOB, dizziness, evaluate for PE  CT ANGIOGRAPHY CHEST  Technique:  Multidetector CT imaging of the chest using the standard protocol during bolus administration of intravenous contrast. Multiplanar reconstructed images including MIPs were obtained and reviewed to evaluate the vascular anatomy.  Contrast: OMNIPAQUE IOHEXOL 350 MG/ML SOLN  Comparison: 01/03/2012  Findings: No evidence of pulmonary embolism.  Calcified granulomata in the left lower lobe and left perihilar region.  No pleural effusion or pneumothorax.  The heart is normal in size.  No pericardial effusion.  Calcified left hilar lymph nodes.  No suspicious mediastinal, hilar, or axillary lymphadenopathy.  Visualized upper abdomen is unremarkable.  Degenerative changes of the visualized thoracolumbar spine.  IMPRESSION: No evidence of pulmonary embolism.  No evidence of acute cardiopulmonary disease.  Sequela of prior granulomatous disease.   Original Report Authenticated By: Charline Bills, M.D.    MDM   1. Dizziness     Patient with sudden onset dizziness, lightheadedness and diaphoresis. Tachycardic, tachypneic upon arrival to ED. CAD vs PE as most likely diagnoses. Labs obtained- cbc, bmp, troponin, d dimer. Tachycardia, tachypnea, age warranted my decision to obtain d-dimer along with nature of symptoms. Troponin negative. EKG unremarkable. CXR normal. D-dimer slightly elevated, will obtain CTA to r/o PE. IVF running, patient no longer dizzy, tachycardic. He is afebrile. 9:34 AM CTA negative for PE. Due to nature of symptoms, no signs of infection, will consult for admission for obs. Patient discussed with  current attending Dr. Micheline Maze who agrees with plan of care. Admission for obs accepted by Dr. Mitchel Honour, TRH.   Trevor Mace, PA-C 01/02/13 [phone]

## 2013-01-02 NOTE — Care Management Note (Addendum)
    Page 1 of 1   01/03/2013     1:44:05 PM   CARE MANAGEMENT NOTE 01/03/2013  Patient:  [name], [name]   Account Number:  [id number]  Date Initiated:  01/02/2013  Documentation initiated by:  Lanier Clam  Subjective/Objective Assessment:   57 Y/O M ADMITTED W/DIZZINESS.     Action/Plan:   FROM HOME W/SPOUSE.   Anticipated DC Date:  01/03/2013   Anticipated DC Plan:  HOME/SELF CARE      DC Planning Services  CM consult      Choice offered to / List presented to:             Status of service:  Completed, signed off Medicare Important Message given?   (If response is "NO", the following Medicare IM given date fields will be blank) Date Medicare IM given:   Date Additional Medicare IM given:    Discharge Disposition:  HOME/SELF CARE  Per UR Regulation:  Reviewed for med. necessity/level of care/duration of stay  If discussed at Long Length of Stay Meetings, dates discussed:    Comments:  01/03/13 Bodey Frizell RN,BSN NCM [phone] D/C HOME NO NEEDS OR ORDERS.  01/02/13 Shruti Arrey RN,BSN NCM [phone]

## 2013-01-02 NOTE — ED Provider Notes (Signed)
Medical screening examination/treatment/procedure(s) were performed by non-physician practitioner and as supervising physician I was immediately available for consultation/collaboration.  Courtney F Horton, MD 01/02/13 1441 

## 2013-01-02 NOTE — Progress Notes (Signed)
Pharmacy - Brief Note  Per order comments pharmacy may adjust, Lovenox adjusted to 60mg  SQ q24h (~0.5mg /kg) for BMI > 30 (BMI = 44).    Thanks.  Juliette Alcide, PharmD, BCPS.   Pager: [phone] 01/02/2013 4:09 PM

## 2013-01-02 NOTE — H&P (Signed)
[facility] History and Physical  KLEVER TWYFORD WUJ:[id number] DOB: [birth year] DOA: 01/02/2013  Referring physician:EDP PCP: [facility]   Chief Complaint: dizziness  HPI: [name] is a 57 y.o. male with h/o HTN, anxiety, Dyslipidemia, prior tobacco use presents to the ER with the above complaint. He reports intermittent dizziness off and on for 1-2 weeks and less frequently before that as well. This is not always positional, occasionally happening while resting in bed as well and self limiting. This am woke up and started experiencing more profound dizziness with diaphoresis and transient shortness of breath. Was brought to the ER, symptoms improved spontaneously. Denies any chest pain, palpitation, fevers, chills, abd pain, N/V/D  He had transient sharp, L sided chest pain last week and none since. Evaluation in ER was unremarkable and had normal EKG and CTA chest  Review of Systems: The patient denies anorexia, fever, weight loss,, vision loss, decreased hearing, hoarseness, chest pain, syncope, dyspnea on exertion, peripheral edema, balance deficits, hemoptysis, abdominal pain, melena, hematochezia, severe indigestion/heartburn, hematuria, incontinence, genital sores, muscle weakness, suspicious skin lesions, transient blindness, difficulty walking, depression, unusual weight change, abnormal bleeding, enlarged lymph nodes, angioedema, and breast masses.    Past Medical History  Diagnosis Date  . Hypertension   . Hyperchloremia   . Acid reflux   . Seasonal allergies   . Asthma   . Hyperlipidemia   . Restless leg syndrome    Past Surgical History  Procedure Laterality Date  . Replacement total knee    . Lumbar disc surgery    . Gastric resection     Social History:  reports that he quit smoking about 15 years ago. He has never used smokeless tobacco. He reports that  drinks alcohol. He reports that he uses illicit drugs (Marijuana). Lives at home with  spouse  No Known Allergies  family history Mother with CAD but not premature  Prior to Admission medications   Medication Sig Start Date End Date Taking? Authorizing Provider  Cetirizine HCl 10 MG CAPS Take 10 mg by mouth daily.    Yes Historical Provider, MD  cyclobenzaprine (FLEXERIL) 10 MG tablet Take 10 mg by mouth 3 (three) times daily as needed for muscle spasms.    Yes Historical Provider, MD  doxazosin (CARDURA) 8 MG tablet Take 8 mg by mouth at bedtime.     Yes Historical Provider, MD  naproxen (NAPROSYN) 500 MG tablet Take 500 mg by mouth 2 (two) times daily with a meal.   Yes Historical Provider, MD  omeprazole (PRILOSEC) 20 MG capsule Take 20 mg by mouth daily.   Yes Historical Provider, MD  pramipexole (MIRAPEX) 0.25 MG tablet Take 0.25 mg by mouth daily.   Yes Historical Provider, MD  pravastatin (PRAVACHOL) 20 MG tablet Take 20 mg by mouth daily.     Yes Historical Provider, MD   Physical Exam: Filed Vitals:   01/02/13 1433  BP: 159/97  Pulse: 94  Temp:   Resp:      General: Obese, AA male, no distress  HEENT: PERRLA, EOMI  CVS: S1S2/RRR  Lungs: CTAB  Abd: soft, Nt, BS present  Skin:no rashes   Musculoskeletal: no edema c/c  Psychiatric: appropriate mood and affect  Neurologic: non focal  Labs on Admission:  Basic Metabolic Panel:  Recent Labs Lab 01/02/13 0700  NA 140  K 3.9  CL 104  CO2 30  GLUCOSE 111*  BUN 17  CREATININE 1.09  CALCIUM 9.3   Liver Function  Tests: No results found for this basename: AST, ALT, ALKPHOS, BILITOT, PROT, ALBUMIN,  in the last 168 hours No results found for this basename: LIPASE, AMYLASE,  in the last 168 hours No results found for this basename: AMMONIA,  in the last 168 hours CBC:  Recent Labs Lab 01/02/13 0700  WBC 5.0  HGB 12.9*  HCT 38.5*  MCV 82.8  PLT 247   Cardiac Enzymes:  Recent Labs Lab 01/02/13 1125 01/02/13 1428  TROPONINI <0.30 <0.30    BNP (last 3 results) No results found  for this basename: PROBNP,  in the last 8760 hours CBG: No results found for this basename: GLUCAP,  in the last 168 hours  Radiological Exams on Admission: Dg Chest 2 View  01/02/2013   *RADIOLOGY REPORT*  Clinical Data: Shortness of breath  CHEST - 2 VIEW  Comparison: CTA chest dated 01/03/2012  Findings: Calcified granuloma in the left lower lobe. Lungs are otherwise clear.  No pleural effusion or pneumothorax.  The heart is normal in size.  Mild degenerative changes of the visualized thoracolumbar spine.  IMPRESSION: No evidence of acute cardiopulmonary disease.   Original Report Authenticated By: Charline Bills, M.D.   Ct Angio Chest Pe W/cm &/or Wo Cm  01/02/2013   *RADIOLOGY REPORT*  Clinical Data: SOB, dizziness, evaluate for PE  CT ANGIOGRAPHY CHEST  Technique:  Multidetector CT imaging of the chest using the standard protocol during bolus administration of intravenous contrast. Multiplanar reconstructed images including MIPs were obtained and reviewed to evaluate the vascular anatomy.  Contrast: OMNIPAQUE IOHEXOL 350 MG/ML SOLN  Comparison: 01/03/2012  Findings: No evidence of pulmonary embolism.  Calcified granulomata in the left lower lobe and left perihilar region.  No pleural effusion or pneumothorax.  The heart is normal in size.  No pericardial effusion.  Calcified left hilar lymph nodes.  No suspicious mediastinal, hilar, or axillary lymphadenopathy.  Visualized upper abdomen is unremarkable.  Degenerative changes of the visualized thoracolumbar spine.  IMPRESSION: No evidence of pulmonary embolism.  No evidence of acute cardiopulmonary disease.  Sequela of prior granulomatous disease.   Original Report Authenticated By: Charline Bills, M.D.    EKG: Independently reviewed. NSR no acute St changes  Assessment/Plan Principal Problem:   Dizziness and giddiness Active Problems:   HYPERCHOLESTEROLEMIA   ANXIETY, CHRONIC   ESSENTIAL HYPERTENSION   DISC DISEASE, LUMBOSACRAL  SPINE   1. Dizziness/PResyncope -monitor on Tele -check cardiac enzymes and 2D echo -check Orthostatics since alpha blockers can cause Orthostatic hypotension -if above workup negative, could FU with Cardiology for Outpatient stress test  2. HTN -stable -continue ARB, may need to stop cardura if orthostatic  3. chronic anxiety -stable, Fu with Psychiatry  4. Dyslipidemia -continue statin, check AM lipids  5. Mild hyperglycemia -check hbaic  Code Status: Full Family Communication:d/w pt and wife at bedside Disposition Plan: home tomorrow if improved  Time spent: 46  [facility] Pager [phone]  If 7PM-7AM, please contact night-coverage [email or web address] Password [facility] 01/02/2013, 3:59 PM

## 2013-01-02 NOTE — Progress Notes (Signed)
  Echocardiogram 2D Echocardiogram has been performed.  [name] 01/02/2013, 12:12 PM

## 2013-01-03 DIAGNOSIS — J309 Allergic rhinitis, unspecified: Secondary | ICD-10-CM

## 2013-01-03 LAB — LIPID PANEL
LDL Cholesterol: 79 mg/dL (ref 0–99)
Triglycerides: 222 mg/dL — ABNORMAL HIGH (ref <150)
VLDL: 44 mg/dL — ABNORMAL HIGH (ref 0–40)

## 2013-01-03 LAB — CBC
HCT: 38.7 % — ABNORMAL LOW (ref 39.0–52.0)
Hemoglobin: 12.6 g/dL — ABNORMAL LOW (ref 13.0–17.0)
MCHC: 32.6 g/dL (ref 30.0–36.0)
RBC: 4.61 MIL/uL (ref 4.22–5.81)
WBC: 4.5 10*3/uL (ref 4.0–10.5)

## 2013-01-03 LAB — BASIC METABOLIC PANEL
BUN: 15 mg/dL (ref 6–23)
CO2: 30 mEq/L (ref 19–32)
Chloride: 105 mEq/L (ref 96–112)
Glucose, Bld: 115 mg/dL — ABNORMAL HIGH (ref 70–99)
Potassium: 4.3 mEq/L (ref 3.5–5.1)
Sodium: 139 mEq/L (ref 135–145)

## 2013-01-03 MED ORDER — HYDRALAZINE HCL 25 MG PO TABS: 25.0000 mg | ORAL_TABLET | Freq: Three times a day (TID) | ORAL | Status: DC

## 2013-01-03 MED ORDER — LISINOPRIL 10 MG PO TABS: 10.0000 mg | ORAL_TABLET | Freq: Every day | ORAL | Status: DC

## 2013-01-03 MED ORDER — HYDRALAZINE HCL 25 MG PO TABS
25.0000 mg | ORAL_TABLET | Freq: Three times a day (TID) | ORAL | Status: DC
  Filled 2013-01-03 (×3): qty 1

## 2013-01-03 NOTE — Discharge Summary (Signed)
Physician Discharge Summary  [name] EAV:[id number] DOB: [birth year] DOA: 01/02/2013  PCP: [facility]  Admit date: 01/02/2013 Discharge date: 01/03/2013  Recommendations for Outpatient Follow-up:  1. Pt will need to follow up with PCP in 2-3 weeks post discharge 2. Please obtain BMP to evaluate electrolytes and kidney function 3. Please also check CBC to evaluate Hg and Hct levels 4. Please note that pt was started on Lisinopril and Hydralazine for BP control 5. Consider d/c dozxazosin if pt has persistent dizziness   Discharge Diagnoses:  Principal Problem:   Dizziness and giddiness Active Problems:   HYPERCHOLESTEROLEMIA   ANXIETY, CHRONIC   ESSENTIAL HYPERTENSION   DISC DISEASE, LUMBOSACRAL SPINE    Discharge Condition: Stable  Diet recommendation: Heart healthy diet discussed in details   History of present illness:  57 y.o. male with h/o HTN, anxiety, Dyslipidemia, prior tobacco use presented to the ER with intermittent dizziness off and on for 1-2 weeks and less frequently before that as well. This is not always positional, occasionally happening while resting in bed as well and self limiting. He denied chest pain or shortness of breath, no fevers, chills, no other systemic concerns and upon arrival to ED his symptoms already resolved.   Evaluation in ER was unremarkable and had normal EKG and CTA chest  Hospital Course:  Principal Problem:   Dizziness and giddiness - possibly related to doxazosin, pt says he has been on this medication for some time - other possible etiology is uncontrolled HTN as pt has not been taking medications due to the cost and he is unable to afford it - we have started pt on Lisinopril and Hydralazine as both medications are on $4 drug list - he was advised to check his BP regularly and to call PCP if the numbers are > 140/90 Active Problems:   HYPERCHOLESTEROLEMIA - continue statin    ANXIETY, CHRONIC - stable    ESSENTIAL  HYPERTENSION - started on hydralazine and lisinopril and tolerated well - improvement in BP noted over 24 hours - continue same regimen upon discharge and follow up with PCP in 1-2 weeks    DISC DISEASE, LUMBOSACRAL SPINE - continue PT as pt able to tolerate   Procedures/Studies: Dg Chest 2 View  01/02/2013  No evidence of acute cardiopulmonary disease.    Ct Angio Chest Pe W/cm &/or Wo Cm  01/02/2013  No evidence of pulmonary embolism.  No evidence of acute cardiopulmonary disease.  Sequela of prior granulomatous disease.     Consultations:  None  Antibiotics:  None  Discharge Exam: Filed Vitals:   01/03/13 0732  BP: 156/99  Pulse: 83  Temp:   Resp:    Filed Vitals:   01/02/13 1436 01/02/13 2113 01/03/13 0500 01/03/13 0732  BP:  157/90 173/105 156/99  Pulse:  87 86 83  Temp:  97.8 F (36.6 C) 98 F (36.7 C)   TempSrc:  Oral Oral   Resp:  18 18   Height: 5\' 7"  (1.702 m)     Weight: 129.23 kg (284 lb 14.4 oz)     SpO2:  100% 100%     General: Pt is alert, follows commands appropriately, not in acute distress Cardiovascular: Regular rate and rhythm, S1/S2 +, no murmurs, no rubs, no gallops Respiratory: Clear to auscultation bilaterally, no wheezing, no crackles, no rhonchi Abdominal: Soft, non tender, non distended, bowel sounds +, no guarding Extremities: no edema, no cyanosis, pulses palpable bilaterally DP and PT Neuro: Grossly nonfocal  Discharge Instructions  Discharge Orders   Future Appointments Provider Department Dept Phone   01/11/2013 8:00 PM [facility] [phone]   Future Orders Complete By Expires   Diet - low sodium heart healthy  As directed    Increase activity slowly  As directed        Medication List         Cetirizine HCl 10 MG Caps  Take 10 mg by mouth daily.     cyclobenzaprine 10 MG tablet  Commonly known as:  FLEXERIL  Take 10 mg by mouth 3 (three) times daily as needed for  muscle spasms.     doxazosin 8 MG tablet  Commonly known as:  CARDURA  Take 8 mg by mouth at bedtime.     hydrALAZINE 25 MG tablet  Commonly known as:  APRESOLINE  Take 1 tablet (25 mg total) by mouth every 8 (eight) hours.     lisinopril 10 MG tablet  Commonly known as:  PRINIVIL  Take 1 tablet (10 mg total) by mouth daily.     naproxen 500 MG tablet  Commonly known as:  NAPROSYN  Take 500 mg by mouth 2 (two) times daily with a meal.     omeprazole 20 MG capsule  Commonly known as:  PRILOSEC  Take 20 mg by mouth daily.     pramipexole 0.25 MG tablet  Commonly known as:  MIRAPEX  Take 0.25 mg by mouth daily.     pravastatin 20 MG tablet  Commonly known as:  PRAVACHOL  Take 20 mg by mouth daily.           Follow-up Information   Follow up with [facility] In 2 weeks.   [address] [place] [address] [phone]       Follow up with Debbora Presto, MD. (call my cell phone with any questions [phone])    Specialty:  Internal Medicine   Contact information:   [address] [place] [address] [phone]        The results of significant diagnostics from this hospitalization (including imaging, microbiology, ancillary and laboratory) are listed below for reference.     Microbiology: No results found for this or any previous visit (from the past 240 hour(s)).   Labs: Basic Metabolic Panel:  Recent Labs Lab 01/02/13 0700 01/03/13 0445  NA 140 139  K 3.9 4.3  CL 104 105  CO2 30 30  GLUCOSE 111* 115*  BUN 17 15  CREATININE 1.09 0.98  CALCIUM 9.3 9.2   CBC:  Recent Labs Lab 01/02/13 0700 01/03/13 0445  WBC 5.0 4.5  HGB 12.9* 12.6*  HCT 38.5* 38.7*  MCV 82.8 83.9  PLT 247 226   Cardiac Enzymes:  Recent Labs Lab 01/02/13 1125 01/02/13 1428 01/02/13 2245  TROPONINI <0.30 <0.30 <0.30   SIGNED: Time coordinating discharge: Over 30 minutes  Debbora Presto, MD  [facility] 01/03/2013, 10:02 AM Pager [phone]  If 7PM-7AM, please contact night-coverage [email or web address] Password [email or web address]

## 2013-01-11 ENCOUNTER — Ambulatory Visit (HOSPITAL_BASED_OUTPATIENT_CLINIC_OR_DEPARTMENT_OTHER): Payer: Medicaid Other | Attending: Internal Medicine

## 2013-01-11 VITALS — Ht 67.0 in | Wt 280.0 lb

## 2013-01-11 DIAGNOSIS — G4733 Obstructive sleep apnea (adult) (pediatric): Secondary | ICD-10-CM

## 2013-01-13 DIAGNOSIS — G4733 Obstructive sleep apnea (adult) (pediatric): Secondary | ICD-10-CM

## 2013-01-14 NOTE — Procedures (Signed)
NAMEYIFAN, AUKER NO.:  [id number]  MEDICAL RECORD NO.:  [id number]          PATIENT TYPE:  OUT  LOCATION:  SLEEP CENTER                 FACILITY[facility]  [facility]  PHYSICIAN:  Clinton D. Maple Hudson, MD, FCCP, FACPDATE OF BIRTH:  04-17-[birth year]  DATE OF STUDY:  01/11/2013                           NOCTURNAL POLYSOMNOGRAM  REFERRING PHYSICIAN:  Fleet Contras, M.D.  INDICATION FOR STUDY:  Hypersomnia with sleep apnea.  EPWORTH SLEEPINESS SCORE:  10/24.  BMI 44, weight 280 pounds, height 67 inches, neck 19 inches.  MEDICATIONS:  Home medications are charted for review.  SLEEP ARCHITECTURE:  Total sleep time 307 minutes with sleep efficiency 84.6%.  Stage I was 11.6%, stage II 79%, stage III absent, REM 9.4% of total sleep time.  Sleep latency 3.5 minutes, REM latency 330 minutes. Awake after sleep onset 52 minutes.  Arousal index 4.9.  BEDTIME MEDICATION:  Naprosyn, hydralazine, pramipexole, cyclobenzaprine.  RESPIRATORY DATA:  Apnea-hypopnea index (AHI) 16.6 per hour.  A total of 310 events were scored including 75 obstructive apneas and 235 hypopneas.  Events were not positional.  REM AHI 60 per hour.  The patient qualified for split protocol CPAP titration, but he refused to wear CPAP "due to severe anxiety disorder."  OXYGEN DATA:  Very loud snoring with oxygen desaturation to a nadir of 60% and mean oxygen saturation through the study of 92.4% on room air.  CARDIAC DATA:  Sinus rhythm with occasional PAC.  MOVEMENT-PARASOMNIA:  A total of 87 limb jerks were counted, which only 1 was associated with arousal or awakening for periodic limb movement with arousal index of 0.2 per hour.  Bathroom x2.  IMPRESSIONS-RECOMMENDATIONS: 1. Moderate obstructive sleep apnea/hypopnea syndrome, AHI 16.6 per hour     with non-positional events.  Very loud snoring with oxygen     desaturation to a nadir of 60% and mean oxygen saturation through     the study of 92.4% on room  air. 2. The patient refused CPAP, citing severe anxiety disorder.  If     appropriate, the patient can return for dedicated CPAP titration     study, perhaps bringing an anxiolytic medication if appropriate.     Clinton D. Maple Hudson, MD, [facility], FACP Diplomate, American Board of Sleep Medicine    CDY/MEDQ  D:  01/13/2013 12:13:30  T:  01/14/2013 03:02:27  Job:  [id number]

## 2013-01-17 ENCOUNTER — Emergency Department (HOSPITAL_COMMUNITY)
Admission: EM | Admit: 2013-01-17 | Discharge: 2013-01-17 | Disposition: A | Discharge status: Home / Self Care | Payer: Medicaid Other | Attending: Emergency Medicine | Admitting: Emergency Medicine

## 2013-01-17 ENCOUNTER — Encounter (HOSPITAL_COMMUNITY): Payer: Self-pay | Admitting: Emergency Medicine

## 2013-01-17 DIAGNOSIS — R51 Headache: Secondary | ICD-10-CM | POA: Insufficient documentation

## 2013-01-17 DIAGNOSIS — G2581 Restless legs syndrome: Secondary | ICD-10-CM | POA: Insufficient documentation

## 2013-01-17 DIAGNOSIS — I10 Essential (primary) hypertension: Secondary | ICD-10-CM | POA: Insufficient documentation

## 2013-01-17 DIAGNOSIS — IMO0002 Reserved for concepts with insufficient information to code with codable children: Secondary | ICD-10-CM | POA: Insufficient documentation

## 2013-01-17 DIAGNOSIS — J45909 Unspecified asthma, uncomplicated: Secondary | ICD-10-CM | POA: Insufficient documentation

## 2013-01-17 DIAGNOSIS — Z87891 Personal history of nicotine dependence: Secondary | ICD-10-CM | POA: Insufficient documentation

## 2013-01-17 DIAGNOSIS — R42 Dizziness and giddiness: Secondary | ICD-10-CM | POA: Insufficient documentation

## 2013-01-17 DIAGNOSIS — I951 Orthostatic hypotension: Secondary | ICD-10-CM

## 2013-01-17 DIAGNOSIS — Z791 Long term (current) use of non-steroidal anti-inflammatories (NSAID): Secondary | ICD-10-CM | POA: Insufficient documentation

## 2013-01-17 DIAGNOSIS — E785 Hyperlipidemia, unspecified: Secondary | ICD-10-CM | POA: Insufficient documentation

## 2013-01-17 DIAGNOSIS — Z79899 Other long term (current) drug therapy: Secondary | ICD-10-CM | POA: Insufficient documentation

## 2013-01-17 DIAGNOSIS — K219 Gastro-esophageal reflux disease without esophagitis: Secondary | ICD-10-CM | POA: Insufficient documentation

## 2013-01-17 LAB — CBC WITH DIFFERENTIAL/PLATELET
Eosinophils Absolute: 0.2 10*3/uL (ref 0.0–0.7)
Eosinophils Relative: 4 % (ref 0–5)
Hemoglobin: 13.2 g/dL (ref 13.0–17.0)
Lymphs Abs: 1.1 10*3/uL (ref 0.7–4.0)
MCH: 27.7 pg (ref 26.0–34.0)
MCV: 82.8 fL (ref 78.0–100.0)
Monocytes Absolute: 0.5 10*3/uL (ref 0.1–1.0)
Monocytes Relative: 11 % (ref 3–12)
RBC: 4.76 MIL/uL (ref 4.22–5.81)

## 2013-01-17 LAB — BASIC METABOLIC PANEL
BUN: 13 mg/dL (ref 6–23)
Calcium: 9.7 mg/dL (ref 8.4–10.5)
Creatinine, Ser: 0.98 mg/dL (ref 0.50–1.35)
GFR calc non Af Amer: >90 mL/min (ref >90)
Glucose, Bld: 109 mg/dL — ABNORMAL HIGH (ref 70–99)

## 2013-01-17 MED ORDER — ONDANSETRON HCL 4 MG/2ML IJ SOLN
4.0000 mg | Freq: Once | INTRAMUSCULAR | Status: AC
  Administered 2013-01-17: 4 mg via INTRAVENOUS
  Filled 2013-01-17: qty 2

## 2013-01-17 MED ORDER — MORPHINE SULFATE 4 MG/ML IJ SOLN
4.0000 mg | Freq: Once | INTRAMUSCULAR | Status: AC
  Administered 2013-01-17: 4 mg via INTRAVENOUS
  Filled 2013-01-17: qty 1

## 2013-01-17 MED ORDER — SODIUM CHLORIDE 0.9 % IV BOLUS (SEPSIS)
500.0000 mL | Freq: Once | INTRAVENOUS | Status: AC
  Administered 2013-01-17: 500 mL via INTRAVENOUS

## 2013-01-17 NOTE — ED Provider Notes (Signed)
CSN: [id number]     Arrival date & time 01/17/13  1136 History   First MD Initiated Contact with Patient 01/17/13 1140     Chief Complaint  Patient presents with  . Hypertension   HPI  [name] presents with an episode of dizziness. He is admitted to the hospital for hypertensive urgency and dizziness. Rule out any myocardial abnormalities. Blood pressure was controlled he was discharged on hydralazine. Also continues on lisinopril and his Cardura. He's not had any other episodes of dizziness until the day after his hospitalization.  He was at home. He he stood up he felt dizzy. His wife checked his blood pressure. She has a wrist cuff was a rather large man with large arms. Blood pressure she states is 155/119 nearsighted was 150/120. They became concerned. They state he was told to get his blood pressure, renal function, hemoglobin rechecked in a few weeks. His appointment is still weak away. He is taking Cardura for years. He does take in the morning. We did discuss perhaps taking Cardura at night because of his orthostatic fracture. He has a mild headache now. His dizziness has resolved  Past Medical History  Diagnosis Date  . Hypertension   . Hyperchloremia   . Acid reflux   . Seasonal allergies   . Asthma   . Hyperlipidemia   . Restless leg syndrome    Past Surgical History  Procedure Laterality Date  . Replacement total knee    . Lumbar disc surgery    . Gastric resection     History reviewed. No pertinent family history. History  Substance Use Topics  . Smoking status: Former Smoker    Quit date: 07/30/1997  . Smokeless tobacco: Never Used  . Alcohol Use: Yes    Review of Systems  Constitutional: Negative for fever, chills, diaphoresis, appetite change and fatigue.  HENT: Negative for sore throat, mouth sores and trouble swallowing.   Eyes: Negative for visual disturbance.  Respiratory: Negative for cough, chest tightness, shortness of breath and wheezing.    Cardiovascular: Negative for chest pain, palpitations and leg swelling.  Gastrointestinal: Negative for nausea, vomiting, abdominal pain, diarrhea and abdominal distention.  Endocrine: Negative for polydipsia, polyphagia and polyuria.  Genitourinary: Negative for dysuria, frequency, hematuria and decreased urine volume.  Musculoskeletal: Negative for gait problem.  Skin: Negative for color change, pallor and rash.  Neurological: Positive for dizziness and headaches. Negative for syncope and light-headedness.  Hematological: Does not bruise/bleed easily.  Psychiatric/Behavioral: Negative for behavioral problems and confusion.    Allergies  Review of patient's allergies indicates no known allergies.  Home Medications   Current Outpatient Rx  Name  Route  Sig  Dispense  Refill  . Cetirizine HCl 10 MG CAPS   Oral   Take 10 mg by mouth daily.          . cyclobenzaprine (FLEXERIL) 10 MG tablet   Oral   Take 10 mg by mouth 3 (three) times daily as needed for muscle spasms.          Marland Kitchen doxazosin (CARDURA) 8 MG tablet   Oral   Take 8 mg by mouth every morning.          . Fluticasone-Salmeterol (ADVAIR) 250-50 MCG/DOSE AEPB   Inhalation   Inhale 1 puff into the lungs every 12 (twelve) hours.         . hydrALAZINE (APRESOLINE) 25 MG tablet   Oral   Take 25 mg by mouth 3 (three) times daily.         Marland Kitchen  lisinopril (PRINIVIL,ZESTRIL) 10 MG tablet   Oral   Take 10 mg by mouth daily.         . naproxen (NAPROSYN) 500 MG tablet   Oral   Take 500 mg by mouth 2 (two) times daily with a meal.         . omeprazole (PRILOSEC) 20 MG capsule   Oral   Take 20 mg by mouth daily.         Marland Kitchen oxymetazoline (AFRIN) 0.05 % nasal spray   Nasal   Place 2 sprays into the nose 2 (two) times daily as needed for congestion.         . pramipexole (MIRAPEX) 0.25 MG tablet   Oral   Take 0.25 mg by mouth at bedtime.          . pravastatin (PRAVACHOL) 20 MG tablet   Oral   Take  20 mg by mouth daily.            BP 157/82  Pulse 98  Temp(Src) 98.1 F (36.7 C) (Oral)  Resp 16  SpO2 92% Physical Exam  Constitutional: He is oriented to person, place, and time. He appears well-developed and well-nourished. No distress.  HENT:  Head: Normocephalic.  No nystagmus  Eyes: Conjunctivae are normal. Pupils are equal, round, and reactive to light. No scleral icterus.  Neck: Normal range of motion. Neck supple. No thyromegaly present.  Cardiovascular: Normal rate, regular rhythm and S1 normal.  Exam reveals no gallop, no distant heart sounds and no friction rub.   No murmur heard. Pulmonary/Chest: Effort normal and breath sounds normal. No respiratory distress. He has no wheezes. He has no rales.  Abdominal: Soft. Bowel sounds are normal. He exhibits no distension. There is no tenderness. There is no rebound.  Musculoskeletal: Normal range of motion.  Neurological: He is alert and oriented to person, place, and time.  Skin: Skin is warm and dry. No rash noted.  No edema  Psychiatric: He has a normal mood and affect. His behavior is normal.    ED Course  Procedures (including critical care time) Labs Review Labs Reviewed  BASIC METABOLIC PANEL - Abnormal; Notable for the following:    Glucose, Bld 109 (*)    All other components within normal limits  CBC WITH DIFFERENTIAL   Imaging Review No results found.  MDM   1. Hypertension   2. Orthostasis    He describes his dizziness as orthostasis. We did discuss perhaps taking his doxazosin/Cardura at night rather than the morning. Plan is we will simply hydrate him which may very likely help his orthostatic dizziness. He clearly does not have vertigo or nystagmus. We'll recheck his kidney function and his hemoglobin. His blood pressure here is 138/87. I suspect that this may be an inaccurate reading of the blood pressures at home.    Claudean Kinds, MD 01/17/13 [phone]

## 2013-02-26 ENCOUNTER — Other Ambulatory Visit: Payer: Self-pay | Admitting: Neurological Surgery

## 2013-02-26 DIAGNOSIS — M549 Dorsalgia, unspecified: Secondary | ICD-10-CM

## 2013-02-28 ENCOUNTER — Ambulatory Visit
Admission: RE | Admit: 2013-02-28 | Discharge: 2013-02-28 | Disposition: A | Discharge status: Home / Self Care | Payer: Medicaid Other | Source: Ambulatory Visit | Attending: Neurological Surgery | Admitting: Neurological Surgery

## 2013-02-28 DIAGNOSIS — M549 Dorsalgia, unspecified: Secondary | ICD-10-CM

## 2013-02-28 MED ORDER — GADOBENATE DIMEGLUMINE 529 MG/ML IV SOLN
20.0000 mL | Freq: Once | INTRAVENOUS | Status: AC | PRN
  Administered 2013-02-28: 20 mL via INTRAVENOUS

## 2013-03-09 ENCOUNTER — Other Ambulatory Visit: Payer: Self-pay | Admitting: Neurological Surgery

## 2013-03-09 DIAGNOSIS — M549 Dorsalgia, unspecified: Secondary | ICD-10-CM

## 2013-03-15 ENCOUNTER — Ambulatory Visit
Admission: RE | Admit: 2013-03-15 | Discharge: 2013-03-15 | Disposition: A | Discharge status: Home / Self Care | Payer: Medicaid Other | Source: Ambulatory Visit | Attending: Neurological Surgery | Admitting: Neurological Surgery

## 2013-03-15 VITALS — BP 117/72 | HR 81 | Ht 67.0 in | Wt 284.0 lb

## 2013-03-15 DIAGNOSIS — M549 Dorsalgia, unspecified: Secondary | ICD-10-CM

## 2013-03-15 DIAGNOSIS — M5137 Other intervertebral disc degeneration, lumbosacral region: Secondary | ICD-10-CM

## 2013-03-15 MED ORDER — DIAZEPAM 5 MG PO TABS
10.0000 mg | ORAL_TABLET | Freq: Once | ORAL | Status: AC
  Administered 2013-03-15: 10 mg via ORAL

## 2013-03-15 MED ORDER — IOHEXOL 180 MG/ML  SOLN
15.0000 mL | Freq: Once | INTRAMUSCULAR | Status: AC | PRN
  Administered 2013-03-15: 15 mL via INTRATHECAL

## 2013-09-13 ENCOUNTER — Emergency Department (HOSPITAL_COMMUNITY)
Admission: EM | Admit: 2013-09-13 | Discharge: 2013-09-13 | Disposition: A | Discharge status: Home / Self Care | Payer: No Typology Code available for payment source | Attending: Emergency Medicine | Admitting: Emergency Medicine

## 2013-09-13 ENCOUNTER — Encounter (HOSPITAL_COMMUNITY): Payer: Self-pay | Admitting: Emergency Medicine

## 2013-09-13 ENCOUNTER — Emergency Department (HOSPITAL_COMMUNITY): Payer: No Typology Code available for payment source

## 2013-09-13 DIAGNOSIS — Z9889 Other specified postprocedural states: Secondary | ICD-10-CM | POA: Diagnosis not present

## 2013-09-13 DIAGNOSIS — Z79899 Other long term (current) drug therapy: Secondary | ICD-10-CM | POA: Insufficient documentation

## 2013-09-13 DIAGNOSIS — R000 Tachycardia, unspecified: Secondary | ICD-10-CM | POA: Insufficient documentation

## 2013-09-13 DIAGNOSIS — S39012A Strain of muscle, fascia and tendon of lower back, initial encounter: Secondary | ICD-10-CM

## 2013-09-13 DIAGNOSIS — S161XXA Strain of muscle, fascia and tendon at neck level, initial encounter: Secondary | ICD-10-CM

## 2013-09-13 DIAGNOSIS — S139XXA Sprain of joints and ligaments of unspecified parts of neck, initial encounter: Secondary | ICD-10-CM | POA: Insufficient documentation

## 2013-09-13 DIAGNOSIS — IMO0002 Reserved for concepts with insufficient information to code with codable children: Secondary | ICD-10-CM | POA: Diagnosis not present

## 2013-09-13 DIAGNOSIS — E785 Hyperlipidemia, unspecified: Secondary | ICD-10-CM | POA: Diagnosis not present

## 2013-09-13 DIAGNOSIS — S335XXA Sprain of ligaments of lumbar spine, initial encounter: Secondary | ICD-10-CM | POA: Diagnosis not present

## 2013-09-13 DIAGNOSIS — S199XXA Unspecified injury of neck, initial encounter: Secondary | ICD-10-CM | POA: Diagnosis present

## 2013-09-13 DIAGNOSIS — Y9389 Activity, other specified: Secondary | ICD-10-CM | POA: Insufficient documentation

## 2013-09-13 DIAGNOSIS — Y92410 Unspecified street and highway as the place of occurrence of the external cause: Secondary | ICD-10-CM | POA: Diagnosis not present

## 2013-09-13 DIAGNOSIS — S0993XA Unspecified injury of face, initial encounter: Secondary | ICD-10-CM | POA: Diagnosis present

## 2013-09-13 DIAGNOSIS — J45909 Unspecified asthma, uncomplicated: Secondary | ICD-10-CM | POA: Insufficient documentation

## 2013-09-13 DIAGNOSIS — G2581 Restless legs syndrome: Secondary | ICD-10-CM | POA: Insufficient documentation

## 2013-09-13 DIAGNOSIS — Z791 Long term (current) use of non-steroidal anti-inflammatories (NSAID): Secondary | ICD-10-CM | POA: Diagnosis not present

## 2013-09-13 DIAGNOSIS — Z87891 Personal history of nicotine dependence: Secondary | ICD-10-CM | POA: Insufficient documentation

## 2013-09-13 DIAGNOSIS — I10 Essential (primary) hypertension: Secondary | ICD-10-CM | POA: Insufficient documentation

## 2013-09-13 DIAGNOSIS — K219 Gastro-esophageal reflux disease without esophagitis: Secondary | ICD-10-CM | POA: Diagnosis not present

## 2013-09-13 DIAGNOSIS — S298XXA Other specified injuries of thorax, initial encounter: Secondary | ICD-10-CM | POA: Insufficient documentation

## 2013-09-13 HISTORY — DX: Spinal stenosis, site unspecified: M48.00

## 2013-09-13 MED ORDER — OXYCODONE-ACETAMINOPHEN 5-325 MG PO TABS
1.0000 | ORAL_TABLET | Freq: Once | ORAL | Status: AC
  Administered 2013-09-13: 1 via ORAL
  Filled 2013-09-13: qty 1

## 2013-09-13 MED ORDER — DIAZEPAM 5 MG PO TABS: 5.0000 mg | ORAL_TABLET | Freq: Two times a day (BID) | ORAL | Status: DC

## 2013-09-13 MED ORDER — HYDROCODONE-ACETAMINOPHEN 5-325 MG PO TABS: 1.0000 | ORAL_TABLET | ORAL | Status: DC | PRN

## 2013-09-13 MED ORDER — DIAZEPAM 5 MG PO TABS
5.0000 mg | ORAL_TABLET | Freq: Once | ORAL | Status: AC
  Administered 2013-09-13: 5 mg via ORAL
  Filled 2013-09-13: qty 1
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
## 2013-09-17 NOTE — ED Provider Notes (Signed)
Medical screening examination/treatment/procedure(s) were performed by non-physician practitioner and as supervising physician I was immediately available for consultation/collaboration.  Leota Jacobsen, MD 09/17/13 [id number]

## 2014-08-21 ENCOUNTER — Emergency Department (HOSPITAL_COMMUNITY)
Admission: EM | Admit: 2014-08-21 | Discharge: 2014-08-21 | Disposition: A | Discharge status: Home / Self Care | Payer: Medicaid Other | Attending: Emergency Medicine | Admitting: Emergency Medicine

## 2014-08-21 ENCOUNTER — Encounter (HOSPITAL_COMMUNITY): Payer: Self-pay | Admitting: *Deleted

## 2014-08-21 DIAGNOSIS — H578 Other specified disorders of eye and adnexa: Secondary | ICD-10-CM | POA: Diagnosis present

## 2014-08-21 DIAGNOSIS — J45909 Unspecified asthma, uncomplicated: Secondary | ICD-10-CM | POA: Insufficient documentation

## 2014-08-21 DIAGNOSIS — Y9389 Activity, other specified: Secondary | ICD-10-CM | POA: Insufficient documentation

## 2014-08-21 DIAGNOSIS — Y9289 Other specified places as the place of occurrence of the external cause: Secondary | ICD-10-CM | POA: Diagnosis not present

## 2014-08-21 DIAGNOSIS — I10 Essential (primary) hypertension: Secondary | ICD-10-CM | POA: Insufficient documentation

## 2014-08-21 DIAGNOSIS — Z792 Long term (current) use of antibiotics: Secondary | ICD-10-CM | POA: Diagnosis not present

## 2014-08-21 DIAGNOSIS — Y998 Other external cause status: Secondary | ICD-10-CM | POA: Diagnosis not present

## 2014-08-21 DIAGNOSIS — Z79899 Other long term (current) drug therapy: Secondary | ICD-10-CM | POA: Insufficient documentation

## 2014-08-21 DIAGNOSIS — S0502XA Injury of conjunctiva and corneal abrasion without foreign body, left eye, initial encounter: Secondary | ICD-10-CM | POA: Diagnosis not present

## 2014-08-21 DIAGNOSIS — Z87891 Personal history of nicotine dependence: Secondary | ICD-10-CM | POA: Diagnosis not present

## 2014-08-21 DIAGNOSIS — Z8739 Personal history of other diseases of the musculoskeletal system and connective tissue: Secondary | ICD-10-CM | POA: Diagnosis not present

## 2014-08-21 DIAGNOSIS — E785 Hyperlipidemia, unspecified: Secondary | ICD-10-CM | POA: Insufficient documentation

## 2014-08-21 DIAGNOSIS — Z8719 Personal history of other diseases of the digestive system: Secondary | ICD-10-CM | POA: Insufficient documentation

## 2014-08-21 DIAGNOSIS — Z791 Long term (current) use of non-steroidal anti-inflammatories (NSAID): Secondary | ICD-10-CM | POA: Insufficient documentation

## 2014-08-21 DIAGNOSIS — X58XXXA Exposure to other specified factors, initial encounter: Secondary | ICD-10-CM | POA: Insufficient documentation

## 2014-08-21 MED ORDER — POLYMYXIN B-TRIMETHOPRIM 10000-0.1 UNIT/ML-% OP SOLN
1.0000 [drp] | OPHTHALMIC | Status: AC
  Administered 2014-08-21: 1 [drp] via OPHTHALMIC
  Filled 2014-08-21: qty 10

## 2014-08-21 MED ORDER — ATROPINE SULFATE 1 % OP SOLN
1.0000 [drp] | OPHTHALMIC | Status: AC
  Administered 2014-08-21: 1 [drp] via OPHTHALMIC
  Filled 2014-08-21: qty 2

## 2014-08-21 MED ORDER — POLYMYXIN B-TRIMETHOPRIM 10000-0.1 UNIT/ML-% OP SOLN: 1.0000 [drp] | OPHTHALMIC | Status: DC

## 2014-08-21 MED ORDER — FLUORESCEIN SODIUM 1 MG OP STRP
1.0000 | ORAL_STRIP | Freq: Once | OPHTHALMIC | Status: AC
  Administered 2014-08-21: 1 via OPHTHALMIC
  Filled 2014-08-21: qty 1

## 2014-08-21 MED ORDER — TETRACAINE HCL 0.5 % OP SOLN
1.0000 [drp] | Freq: Once | OPHTHALMIC | Status: AC
  Administered 2014-08-21: 1 [drp] via OPHTHALMIC
  Filled 2014-08-21: qty 2

## 2014-08-21 MED ORDER — ATROPINE SULFATE 1 % OP SOLN: 1.0000 [drp] | Freq: Three times a day (TID) | OPHTHALMIC | Status: DC

## 2014-08-21 NOTE — ED Provider Notes (Signed)
CSN: [id number]     Arrival date & time 08/21/14  0356 History   First MD Initiated Contact with Patient 08/21/14 [phone]     Chief Complaint  Patient presents with  . Eye Problem     (Consider location/radiation/quality/duration/timing/severity/associated sxs/prior Treatment) HPI Comments: She states he woke up from sleep, rub his eye and then felt a burning sensation.  It been draining since that time.  Denies any previous injury to his eye.  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  He states he was outside walking yesterday in the weather, but did not do any kind of landscaping nor handled any chemicals  Patient is a 59 y.o. male presenting with eye problem. The history is provided by the patient.  Eye Problem Location:  L eye Quality:  Burning and tearing Severity:  Moderate Onset quality:  Sudden Duration:  30 minutes Timing:  Constant Progression:  Unchanged Chronicity:  New Relieved by:  None tried Worsened by:  Nothing tried Ineffective treatments:  None tried Associated symptoms: itching, redness and tearing   Associated symptoms: no blurred vision, no decreased vision, no discharge, no double vision, no facial rash, no foreign body sensation, no headaches, no inflammation, no nausea, no numbness, no photophobia, no scotomas, no swelling, no tingling, no vomiting and no weakness     Past Medical History  Diagnosis Date  . Hypertension   . Hyperchloremia   . Acid reflux   . Seasonal allergies   . Asthma   . Hyperlipidemia   . Restless leg syndrome   . Spinal stenosis    Past Surgical History  Procedure Laterality Date  . Replacement total knee    . Lumbar disc surgery    . Gastric resection    . Back surgery     No family history on file. History  Substance Use Topics  . Smoking status: Former Smoker    Quit date: 07/30/1997  . Smokeless tobacco: Never Used  . Alcohol Use: Yes    Review of Systems  Constitutional: Negative for fever.  HENT: Negative for congestion and  rhinorrhea.   Eyes: Positive for redness and itching. Negative for blurred vision, double vision, photophobia and discharge.  Gastrointestinal: Negative for nausea and vomiting.  Neurological: Negative for tingling, weakness, numbness and headaches.  All other systems reviewed and are negative.     Allergies  Review of patient's allergies indicates no known allergies.  Home Medications   Prior to Admission medications   Medication Sig Start Date End Date Taking? Authorizing Provider  albuterol (PROVENTIL HFA;VENTOLIN HFA) 108 (90 BASE) MCG/ACT inhaler Inhale 2 puffs into the lungs every 6 (six) hours as needed for wheezing or shortness of breath.   Yes Historical Provider, MD  Cetirizine HCl 10 MG CAPS Take 10 mg by mouth daily.    Yes Historical Provider, MD  cyclobenzaprine (FLEXERIL) 10 MG tablet Take 10 mg by mouth 3 (three) times daily as needed for muscle spasms.    Yes Historical Provider, MD  doxazosin (CARDURA) 8 MG tablet Take 8 mg by mouth every morning.    Yes Historical Provider, MD  Fluticasone-Salmeterol (ADVAIR) 250-50 MCG/DOSE AEPB Inhale 1 puff into the lungs 2 (two) times daily as needed (wheezing).    Yes Historical Provider, MD  hydrALAZINE (APRESOLINE) 25 MG tablet Take 25 mg by mouth 3 (three) times daily.   Yes Historical Provider, MD  lisinopril (PRINIVIL,ZESTRIL) 10 MG tablet Take 10 mg by mouth daily.   Yes Historical Provider, MD  metFORMIN (GLUCOPHAGE)  500 MG tablet Take 500 mg by mouth 2 (two) times daily with a meal.   Yes Historical Provider, MD  naproxen (NAPROSYN) 500 MG tablet Take 500 mg by mouth 2 (two) times daily with a meal.   Yes Historical Provider, MD  oxymetazoline (AFRIN) 0.05 % nasal spray Place 2 sprays into the nose 2 (two) times daily as needed for congestion.   Yes Historical Provider, MD  pramipexole (MIRAPEX) 0.25 MG tablet Take 0.25 mg by mouth at bedtime.    Yes Historical Provider, MD  pravastatin (PRAVACHOL) 20 MG tablet Take 20 mg by  mouth daily.     Yes Historical Provider, MD  atropine 1 % ophthalmic solution Place 1 drop into the left eye 3 (three) times daily. 08/21/14   Junius Creamer, NP  diazepam (VALIUM) 5 MG tablet Take 1 tablet (5 mg total) by mouth 2 (two) times daily. Patient not taking: Reported on 08/21/2014 09/13/13   Carman Ching, PA-C  HYDROcodone-acetaminophen (NORCO/VICODIN) 5-325 MG per tablet Take 1-2 tablets by mouth every 4 (four) hours as needed. Patient not taking: Reported on 08/21/2014 09/13/13   Carman Ching, PA-C  trimethoprim-polymyxin b (POLYTRIM) ophthalmic solution Place 1 drop into the left eye every 4 (four) hours. 08/21/14   Junius Creamer, NP   BP 145/97 mmHg  Pulse 79  Temp(Src) 98.2 F (36.8 C) (Oral)  Resp 18  SpO2 99% Physical Exam  Constitutional: He appears well-developed and well-nourished.  HENT:  Head: Normocephalic.  Eyes: EOM are normal. Pupils are equal, round, and reactive to light. Left eye exhibits discharge. Left eye exhibits no exudate. Left conjunctiva is injected. Left conjunctiva has no hemorrhage.  Slit lamp exam:      The left eye shows corneal abrasion and fluorescein uptake. The left eye shows no hyphema.    Nursing note and vitals reviewed.   ED Course  Procedures (including critical care time) Labs Review Labs Reviewed - No data to display  Imaging Review No results found.   EKG Interpretation None     Patient has been given atropine to decrease the contractility of the eye as well as Polytrim with follow-up to be scheduled by the appointment in the next 1-2 days MDM   Final diagnoses:  Corneal abrasion, left, initial encounter        Junius Creamer, NP 08/21/14 [place], DO 08/21/14 [id number]

## 2014-08-21 NOTE — Discharge Instructions (Signed)
Corneal Abrasion The cornea is the clear covering at the front and center of the eye. When looking at the colored portion of the eye (iris), you are looking through the cornea. This very thin tissue is made up of many layers. The surface layer is a single layer of cells (corneal epithelium) and is one of the most sensitive tissues in the body. If a scratch or injury causes the corneal epithelium to come off, it is called a corneal abrasion. If the injury extends to the tissues below the epithelium, the condition is called a corneal ulcer. CAUSES   Scratches.  Trauma.  Foreign body in the eye. Some people have recurrences of abrasions in the area of the original injury even after it has healed (recurrent erosion syndrome). Recurrent erosion syndrome generally improves and goes away with time. SYMPTOMS   Eye pain.  Difficulty or inability to keep the injured eye open.  The eye becomes very sensitive to light.  Recurrent erosions tend to happen suddenly, first thing in the morning, usually after waking up and opening the eye. DIAGNOSIS  Your health care provider can diagnose a corneal abrasion during an eye exam. Dye is usually placed in the eye using a drop or a small paper strip moistened by your tears. When the eye is examined with a special light, the abrasion shows up clearly because of the dye. TREATMENT   Small abrasions may be treated with antibiotic drops or ointment alone.  A pressure patch may be put over the eye. If this is done, follow your doctor's instructions for when to remove the patch. Do not drive or use machines while the eye patch is on. Judging distances is hard to do with a patch on. If the abrasion becomes infected and spreads to the deeper tissues of the cornea, a corneal ulcer can result. This is serious because it can cause corneal scarring. Corneal scars interfere with light passing through the cornea and cause a loss of vision in the involved eye. HOME CARE  INSTRUCTIONS  Use medicine or ointment as directed. Only take over-the-counter or prescription medicines for pain, discomfort, or fever as directed by your health care provider.  Do not drive or operate machinery if your eye is patched. Your ability to judge distances is impaired.  If your health care provider has given you a follow-up appointment, it is very important to keep that appointment. Not keeping the appointment could result in a severe eye infection or permanent loss of vision. If there is any problem keeping the appointment, let your health care provider know. SEEK MEDICAL CARE IF:   You have pain, light sensitivity, and a scratchy feeling in one eye or both eyes.  Your pressure patch keeps loosening up, and you can blink your eye under the patch after treatment.  Any kind of discharge develops from the eye after treatment or if the lids stick together in the morning.  You have the same symptoms in the morning as you did with the original abrasion days, weeks, or months after the abrasion healed. MAKE SURE YOU:   Understand these instructions.  Will watch your condition.  Will get help right away if you are not doing well or get worse. Document Released: 04/16/2000 Document Revised: 04/24/2013 Document Reviewed: 12/25/2012 [facility] Patient Information 2015 [place], [place]. This information is not intended to replace advice given to you by your health care provider. Make sure you discuss any questions you have with your health care provider. Examination shows you  have a corneal abrasion.  You have been given 2 drops, one is to dilate the pupil to decrease the pain.  The other is an antibiotic to decrease irritation.  Please make an appointment with the ophthalmologist for further evaluation and follow-up

## 2014-08-21 NOTE — ED Notes (Signed)
Pt reports waking up this am with L eye irritation with clear drainage.  Pt reports soreness and feeling something in it.  Denies injury.

## 2014-08-30 ENCOUNTER — Encounter (HOSPITAL_COMMUNITY): Payer: Self-pay | Admitting: Emergency Medicine

## 2014-08-30 ENCOUNTER — Emergency Department (HOSPITAL_COMMUNITY)
Admission: EM | Admit: 2014-08-30 | Discharge: 2014-08-30 | Disposition: A | Discharge status: Home / Self Care | Payer: Medicaid Other | Attending: Emergency Medicine | Admitting: Emergency Medicine

## 2014-08-30 DIAGNOSIS — Z87891 Personal history of nicotine dependence: Secondary | ICD-10-CM | POA: Insufficient documentation

## 2014-08-30 DIAGNOSIS — Z791 Long term (current) use of non-steroidal anti-inflammatories (NSAID): Secondary | ICD-10-CM | POA: Diagnosis not present

## 2014-08-30 DIAGNOSIS — J45909 Unspecified asthma, uncomplicated: Secondary | ICD-10-CM | POA: Diagnosis not present

## 2014-08-30 DIAGNOSIS — G2581 Restless legs syndrome: Secondary | ICD-10-CM | POA: Diagnosis not present

## 2014-08-30 DIAGNOSIS — Z8719 Personal history of other diseases of the digestive system: Secondary | ICD-10-CM | POA: Insufficient documentation

## 2014-08-30 DIAGNOSIS — E785 Hyperlipidemia, unspecified: Secondary | ICD-10-CM | POA: Insufficient documentation

## 2014-08-30 DIAGNOSIS — Z79899 Other long term (current) drug therapy: Secondary | ICD-10-CM | POA: Insufficient documentation

## 2014-08-30 DIAGNOSIS — J029 Acute pharyngitis, unspecified: Secondary | ICD-10-CM | POA: Diagnosis not present

## 2014-08-30 DIAGNOSIS — R1310 Dysphagia, unspecified: Secondary | ICD-10-CM | POA: Diagnosis present

## 2014-08-30 DIAGNOSIS — I10 Essential (primary) hypertension: Secondary | ICD-10-CM | POA: Insufficient documentation

## 2014-08-30 MED ORDER — OMEPRAZOLE 20 MG PO CPDR: 20.0000 mg | DELAYED_RELEASE_CAPSULE | Freq: Every day | ORAL | Status: DC

## 2014-08-30 NOTE — Discharge Instructions (Signed)
Dysphagia Swallowing problems (dysphagia) occur when solids and liquids seem to stick in your throat on the way down to your stomach, or the food takes longer to get to the stomach. Other symptoms include regurgitating food, noises coming from the throat, chest discomfort with swallowing, and a feeling of fullness or the feeling of something being stuck in your throat when swallowing. When blockage in your throat is complete, it may be associated with drooling. CAUSES  Problems with swallowing may occur because of problems with the muscles. The food cannot be propelled in the usual manner into your stomach. You may have ulcers, scar tissue, or inflammation in the tube down which food travels from your mouth to your stomach (esophagus), which blocks food from passing normally into the stomach. Causes of inflammation include:  Acid reflux from your stomach into your esophagus.  Infection.  Radiation treatment for cancer.  Medicines taken without enough fluids to wash them down into your stomach. You may have nerve problems that prevent signals from being sent to the muscles of your esophagus to contract and move your food down to your stomach. Globus pharyngeus is a relatively common problem in which there is a sense of an obstruction or difficulty in swallowing, without any physical abnormalities of the swallowing passages being found. This problem usually improves over time with reassurance and testing to rule out other causes. DIAGNOSIS Dysphagia can be diagnosed and its cause can be determined by tests in which you swallow a white substance that helps illuminate the inside of your throat (contrast medium) while X-rays are taken. Sometimes a flexible telescope that is inserted down your throat (endoscopy) to look at your esophagus and stomach is used. TREATMENT   If the dysphagia is caused by acid reflux or infection, medicines may be used.  If the dysphagia is caused by problems with your  swallowing muscles, swallowing therapy may be used to help you strengthen your swallowing muscles.  If the dysphagia is caused by a blockage or mass, procedures to remove the blockage may be done. HOME CARE INSTRUCTIONS  Try to eat soft food that is easier to swallow and check your weight on a daily basis to be sure that it is not decreasing.  Be sure to drink liquids when sitting upright (not lying down). SEEK MEDICAL CARE IF:  You are losing weight because you are unable to swallow.  You are coughing when you drink liquids (aspiration).  You are coughing up partially digested food. SEEK IMMEDIATE MEDICAL CARE IF:  You are unable to swallow your own saliva .  You are having shortness of breath or a fever, or both.  You have a hoarse voice along with difficulty swallowing. MAKE SURE YOU:  Understand these instructions.  Will watch your condition.  Will get help right away if you are not doing well or get worse. Document Released: 04/16/2000 Document Revised: 09/03/2013 Document Reviewed: 10/06/2012 [facility] Patient Information 2015 [place], [place]. This information is not intended to replace advice given to you by your health care provider. Make sure you discuss any questions you have with your health care provider.  Dysphagia Level 1 Diet, Pureed The dysphasia level 1 diet includes foods that are completely pureed and smooth. The foods have a pudding-like texture, such as the texture of pureed pancakes, mashed potatoes, and yogurt. The diet does not include foods with lumps or coarse textures. Liquids should be smooth and may either be thin, nectar-thick, honey-like, or spoon-thick. This diet is helpful for people  with moderate to severe swallowing problems. It reduces the risk of food getting caught in the windpipe, trachea, or lungs. You may need help or supervision during meals while following this diet. WHAT DO I NEED TO KNOW ABOUT THIS DIET? Foods  You may eat foods that  are soft and have a pudding-like texture. If a food does not have this texture, you may be able to eat the food after:  Pureeing it. This can be done with a blender or whisk.  Moistening it with liquid. For example, you may have bread if you soak it in milk or syrup.  Avoid foods that are hard, dry, sticky, chunky, lumpy, or stringy. Also avoid foods with nuts, seeds, raisins, skins, and pulp.  Do not eat foods that you have to chew. If you have to chew the food, then you cannot eat it.  Eat a variety of foods to get all the nutrients you need. Liquids  You may drink liquids that are smooth. Your health care provider will tell you if you should drink thin or thickened liquids.  To thicken a liquid, use a food and beverage thickener or a thickening food. Thickened liquids are usually a "pudding-like" consistency.  Thin liquids include fruit juices, milk, coffee, tea, yogurts, shakes, and similar foods that melt to thin liquid at room temperature.  Avoid liquids with seeds, pulp, or chunks. See your dietitian or health care provider regularly for help with your dietary changes. WHAT FOODS CAN I EAT? Grains Store-bought soft breads, pancakes, and [place] toast that have a smooth, moist texture and do not have nuts or seeds (you will need to moisten the food with liquid). Cooked cereals that have a pudding-like consistency, such as cream of wheat or farina (no oatmeal). Pureed, well-cooked pasta, rice, and plain bread stuffing. Vegetables Pureed vegetables. Soft avocado. Smooth tomato paste or sauce. Strained or pureed soups (these may need to be thickened as directed). Mashed or pureed potatoes without skin (can be seasoned with butter, smooth gravy, margarine, or sour cream). Fruits Pureed fruits such as melons and apples without seeds or pulp. Mashed bananas. Smooth tomato paste or sauce. Fruit juices without pulp or seeds. Strained or pureed soups. Meat and Other Protein Sources Pureed  meat. Smooth pate or liverwurst. Smooth souffles. Pureed beans (such as lentils). Pureed eggs. Dairy Yogurt. Smooth cheese sauces. Milk (may need to be thickened). Nutritional dairy drinks or shakes. Ask your health care provider whether you can have ice cream. Condiments Finely ground salt, pepper, and other ground spices. Sweets/Desserts Smooth puddings and custards. Pureed desserts. Souffles. Whipped topping. Ask your health care provider whether you can have frozen desserts. Fats and Oils Butter. Margarine. Smooth and strained gravy. Sour cream. Mayonnaise. Cream cheese. Whipped topping. Smooth sauces (such as white sauce, cheese sauce, or hollandaise sauce). The items listed above may not be a complete list of recommended foods or beverages. Contact your dietitian for more options. WHAT FOODS ARE NOT RECOMMENDED? Grains Oatmeal. Dry cereals. Hard breads. Vegetables Whole vegetables. Stringy vegetables (such as celery). Thin tomato sauce. Fruits Whole fresh, frozen, canned, or dried fruits that have not been pureed. Stringy fruits (such as pineapple). Meat and Other Protein Sources Whole or ground meat, fish, or poultry. Dried or cooked lentils or legumes that have been cooked but not mashed or pureed. Non-pureed eggs. Nuts and seeds. Peanut butter. Dairy Non-pureed cheese. Dairy products with lumps or chunks. Ask your health care provider whether you can have ice cream. Condiments Coarse  or seeded herbs and spices. Sweets/Desserts [place] preserves. Jams with seeds. Solid desserts. Sticky, chewy sweets (such as licorice and caramel). Ask your health care provider whether you can have frozen desserts. Fats and Oils Sauces of fats with lumps or chunks. The items listed above may not be a complete list of foods and beverages to avoid. Contact your dietitian for more information. Document Released: 04/19/2005 Document Revised: 09/03/2013 Document Reviewed: 04/02/2013 [facility] Patient  Information 2015 [place], [place]. This information is not intended to replace advice given to you by your health care provider. Make sure you discuss any questions you have with your health care provider.  Sore Throat A sore throat is pain, burning, irritation, or scratchiness of the throat. There is often pain or tenderness when swallowing or talking. A sore throat may be accompanied by other symptoms, such as coughing, sneezing, fever, and swollen neck glands. A sore throat is often the first sign of another sickness, such as a cold, flu, strep throat, or mononucleosis (commonly known as mono). Most sore throats go away without medical treatment. CAUSES  The most common causes of a sore throat include:  A viral infection, such as a cold, flu, or mono.  A bacterial infection, such as strep throat, tonsillitis, or whooping cough.  Seasonal allergies.  Dryness in the air.  Irritants, such as smoke or pollution.  Gastroesophageal reflux disease (GERD). HOME CARE INSTRUCTIONS   Only take over-the-counter medicines as directed by your caregiver.  Drink enough fluids to keep your urine clear or pale yellow.  Rest as needed.  Try using throat sprays, lozenges, or sucking on hard candy to ease any pain (if older than 4 years or as directed).  Sip warm liquids, such as broth, herbal tea, or warm water with honey to relieve pain temporarily. You may also eat or drink cold or frozen liquids such as frozen ice pops.  Gargle with salt water (mix 1 tsp salt with 8 oz of water).  Do not smoke and avoid secondhand smoke.  Put a cool-mist humidifier in your bedroom at night to moisten the air. You can also turn on a hot shower and sit in the bathroom with the door closed for 5-10 minutes. SEEK IMMEDIATE MEDICAL CARE IF:  You have difficulty breathing.  You are unable to swallow fluids, soft foods, or your saliva.  You have increased swelling in the throat.  Your sore throat does not get  better in 7 days.  You have nausea and vomiting.  You have a fever or persistent symptoms for more than 2-3 days.  You have a fever and your symptoms suddenly get worse. MAKE SURE YOU:   Understand these instructions.  Will watch your condition.  Will get help right away if you are not doing well or get worse. Document Released: 05/27/2004 Document Revised: 04/05/2012 Document Reviewed: 12/26/2011 [facility] Patient Information 2015 [place], [place]. This information is not intended to replace advice given to you by your health care provider. Make sure you discuss any questions you have with your health care provider.   It is important for you to follow-up with [facility] for further evaluation and management of your difficulty swallowing. It is also important for you to return to the ED if he began to experience difficulties breathing, fevers.

## 2014-08-30 NOTE — ED Notes (Signed)
Pt denied questions, concerns r/t dc. Pt a&ox4, ambulatory

## 2014-08-30 NOTE — ED Provider Notes (Signed)
CSN: [id number]     Arrival date & time 08/30/14  0233 History   First MD Initiated Contact with Patient 08/30/14 1030     Chief Complaint  Patient presents with  . Dysphagia     (Consider location/radiation/quality/duration/timing/severity/associated sxs/prior Treatment) HPI [name] is a 59 y.o. male comes in for evaluation of dysphasia. Patient states for the past week he has had a sensation of food getting caught at the bottom of his chest every time he tries to eat. He reports the same "catching"sensation with liquids. He also reports an associated sore throat, but only when he is eating, drinking or swallowing. He denies any discomfort at rest. Denies swallowing foreign body. No fevers, chills, nausea, vomiting, abdominal pain, shortness of breath. Denies any change in bowel habits. Reports his discomfort as a 4/10. Nothing makes the symptoms better. No other aggravating or modifying factors. Reports he takes antacids daily.  Past Medical History  Diagnosis Date  . Hypertension   . Hyperchloremia   . Acid reflux   . Seasonal allergies   . Asthma   . Hyperlipidemia   . Restless leg syndrome   . Spinal stenosis    Past Surgical History  Procedure Laterality Date  . Replacement total knee    . Lumbar disc surgery    . Gastric resection    . Back surgery     History reviewed. No pertinent family history. History  Substance Use Topics  . Smoking status: Former Smoker    Quit date: 07/30/1997  . Smokeless tobacco: Never Used  . Alcohol Use: Yes    Review of Systems  A 10 point review of systems was completed and was negative except for pertinent positives and negatives as mentioned in the history of present illness     Allergies  Review of patient's allergies indicates no known allergies.  Home Medications   Prior to Admission medications   Medication Sig Start Date End Date Taking? Authorizing Provider  albuterol (PROVENTIL HFA;VENTOLIN HFA) 108 (90 BASE)  MCG/ACT inhaler Inhale 2 puffs into the lungs every 6 (six) hours as needed for wheezing or shortness of breath.   Yes Historical Provider, MD  Cetirizine HCl 10 MG CAPS Take 10 mg by mouth daily.    Yes Historical Provider, MD  cyclobenzaprine (FLEXERIL) 10 MG tablet Take 10 mg by mouth 3 (three) times daily as needed for muscle spasms.    Yes Historical Provider, MD  doxazosin (CARDURA) 8 MG tablet Take 8 mg by mouth every morning.    Yes Historical Provider, MD  Fluticasone-Salmeterol (ADVAIR) 250-50 MCG/DOSE AEPB Inhale 1 puff into the lungs 2 (two) times daily as needed (wheezing).    Yes Historical Provider, MD  hydrALAZINE (APRESOLINE) 25 MG tablet Take 25 mg by mouth 3 (three) times daily.   Yes Historical Provider, MD  lisinopril (PRINIVIL,ZESTRIL) 10 MG tablet Take 10 mg by mouth daily.   Yes Historical Provider, MD  metFORMIN (GLUCOPHAGE) 500 MG tablet Take 500 mg by mouth 2 (two) times daily with a meal.   Yes Historical Provider, MD  naproxen (NAPROSYN) 500 MG tablet Take 500 mg by mouth 2 (two) times daily with a meal.   Yes Historical Provider, MD  oxymetazoline (AFRIN) 0.05 % nasal spray Place 2 sprays into the nose 2 (two) times daily as needed for congestion.   Yes Historical Provider, MD  pramipexole (MIRAPEX) 0.25 MG tablet Take 0.25 mg by mouth at bedtime.    Yes Historical Provider, MD  pravastatin (PRAVACHOL) 20 MG tablet Take 20 mg by mouth daily.     Yes Historical Provider, MD  atropine 1 % ophthalmic solution Place 1 drop into the left eye 3 (three) times daily. Patient not taking: Reported on 08/30/2014 08/21/14   Junius Creamer, NP  diazepam (VALIUM) 5 MG tablet Take 1 tablet (5 mg total) by mouth 2 (two) times daily. Patient not taking: Reported on 08/21/2014 09/13/13   Carman Ching, PA-C  HYDROcodone-acetaminophen (NORCO/VICODIN) 5-325 MG per tablet Take 1-2 tablets by mouth every 4 (four) hours as needed. Patient not taking: Reported on 08/21/2014 09/13/13   Carman Ching, PA-C   omeprazole (PRILOSEC) 20 MG capsule Take 1 capsule (20 mg total) by mouth daily. 08/30/14   Comer Locket, PA-C  trimethoprim-polymyxin b (POLYTRIM) ophthalmic solution Place 1 drop into the left eye every 4 (four) hours. Patient not taking: Reported on 08/30/2014 08/21/14   Junius Creamer, NP   BP 148/87 mmHg  Pulse 80  Temp(Src) 98.1 F (36.7 C) (Oral)  Resp 18  SpO2 96% Physical Exam  Constitutional: He is oriented to person, place, and time. He appears well-developed and well-nourished.  HENT:  Head: Normocephalic and atraumatic.  Mouth/Throat: Oropharynx is clear and moist. No oropharyngeal exudate.  Oropharynx is clear and moist without tonsillitis or unilateral tonsillar swelling. Uvula is midline. No glossal elevation. No evidence of fluctuance, drainable abscess. Patient with appropriate dentition.  Eyes: Conjunctivae are normal. Pupils are equal, round, and reactive to light. Right eye exhibits no discharge. Left eye exhibits no discharge. No scleral icterus.  Neck: Neck supple. No tracheal deviation present.  Mild right-sided cervical adenopathy. No submandibular pain  Cardiovascular: Normal rate, regular rhythm and normal heart sounds.   Pulmonary/Chest: Effort normal and breath sounds normal. No respiratory distress. He has no wheezes. He has no rales.  Abdominal: Soft. He exhibits no distension and no mass. There is no tenderness. There is no rebound and no guarding.  Musculoskeletal: Normal range of motion. He exhibits no tenderness.  Neurological: He is alert and oriented to person, place, and time.  Cranial Nerves II-XII grossly intact  Skin: Skin is warm and dry. No rash noted.  Psychiatric: He has a normal mood and affect.  Nursing note and vitals reviewed.   ED Course  Procedures (including critical care time) Labs Review Labs Reviewed - No data to display  Imaging Review No results found.   EKG Interpretation None     Meds given in ED:  Medications - No  data to display  Discharge Medication List as of 08/30/2014 11:16 AM     Filed Vitals:   08/30/14 1000 08/30/14 1128  BP: 155/102 148/87  Pulse: 80 80  Temp: 98 F (36.7 C) 98.1 F (36.7 C)  TempSrc: Oral Oral  Resp: 20 18  SpO2: 100% 96%    MDM  Vitals stable - WNL -afebrile Pt resting comfortably in ED. PE--oropharynx is clear. No evidence of Ludwig or vincents angina. Doubt peritonsillar or retropharyngeal abscess. Normal lung exam, no absent breath sounds.  DDX--referral to GI given for further evaluation of dysphagia. Instructions provided for dysphagia diet.Will DC with PPI. Also gave strict return precautions if sore throat worsens, occurs at rest with fevers or difficulties breathing.  I discussed all relevant lab findings and imaging results with pt and they verbalized understanding. Discussed f/u with PCP within 48 hrs and return precautions, pt very amenable to plan.  Final diagnoses:  Dysphagia  Sore throat  Comer Locket, PA-C 08/30/14 [place], MD 08/31/14 [phone]

## 2014-08-30 NOTE — ED Notes (Signed)
Pt presents with sore throat that increase with pain on the right side when swallowing. Pt also reports feeling like food and/or fluid gets stuck midway down and then slowly releases as it should. Pt reports the symptoms started x1 week ago. Pt denies chest pain, N/V, or SOB at this time

## 2014-08-30 NOTE — ED Notes (Signed)
Pt c/o sore throat onset 7 days ago, self treated with spray and losenges, no success, c/o pain in throat when he swallows and difficulty swallowing food or drinking. Pt in control of secretions, no signs of distress at this time.

## 2014-10-29 ENCOUNTER — Emergency Department (HOSPITAL_COMMUNITY): Payer: Medicaid Other

## 2014-10-29 ENCOUNTER — Emergency Department (HOSPITAL_COMMUNITY)
Admission: EM | Admit: 2014-10-29 | Discharge: 2014-10-29 | Disposition: A | Discharge status: Home / Self Care | Payer: Medicaid Other | Attending: Emergency Medicine | Admitting: Emergency Medicine

## 2014-10-29 ENCOUNTER — Encounter (HOSPITAL_COMMUNITY): Payer: Self-pay

## 2014-10-29 DIAGNOSIS — Z791 Long term (current) use of non-steroidal anti-inflammatories (NSAID): Secondary | ICD-10-CM | POA: Insufficient documentation

## 2014-10-29 DIAGNOSIS — R600 Localized edema: Secondary | ICD-10-CM | POA: Diagnosis not present

## 2014-10-29 DIAGNOSIS — K219 Gastro-esophageal reflux disease without esophagitis: Secondary | ICD-10-CM | POA: Insufficient documentation

## 2014-10-29 DIAGNOSIS — Z87891 Personal history of nicotine dependence: Secondary | ICD-10-CM | POA: Diagnosis not present

## 2014-10-29 DIAGNOSIS — G2581 Restless legs syndrome: Secondary | ICD-10-CM | POA: Insufficient documentation

## 2014-10-29 DIAGNOSIS — E785 Hyperlipidemia, unspecified: Secondary | ICD-10-CM | POA: Insufficient documentation

## 2014-10-29 DIAGNOSIS — I10 Essential (primary) hypertension: Secondary | ICD-10-CM | POA: Insufficient documentation

## 2014-10-29 DIAGNOSIS — K1379 Other lesions of oral mucosa: Secondary | ICD-10-CM

## 2014-10-29 DIAGNOSIS — Z79899 Other long term (current) drug therapy: Secondary | ICD-10-CM | POA: Diagnosis not present

## 2014-10-29 DIAGNOSIS — J45909 Unspecified asthma, uncomplicated: Secondary | ICD-10-CM | POA: Insufficient documentation

## 2014-10-29 DIAGNOSIS — J029 Acute pharyngitis, unspecified: Secondary | ICD-10-CM | POA: Diagnosis present

## 2014-10-29 LAB — RAPID STREP SCREEN (MED CTR MEBANE ONLY): Streptococcus, Group A Screen (Direct): NEGATIVE

## 2014-10-29 MED ORDER — PREDNISONE 20 MG PO TABS
60.0000 mg | ORAL_TABLET | Freq: Once | ORAL | Status: AC
  Administered 2014-10-29: 60 mg via ORAL
  Filled 2014-10-29: qty 3

## 2014-10-29 MED ORDER — PREDNISONE 20 MG PO TABS: 20.0000 mg | ORAL_TABLET | Freq: Two times a day (BID) | ORAL | Status: DC

## 2014-10-29 MED ORDER — PENICILLIN G BENZATHINE 1200000 UNIT/2ML IM SUSP: 2.4000 10*6.[IU] | Freq: Once | INTRAMUSCULAR | Status: DC

## 2014-10-29 MED ORDER — DIPHENHYDRAMINE HCL 25 MG PO TABS: 25.0000 mg | ORAL_TABLET | Freq: Four times a day (QID) | ORAL | Status: DC

## 2014-10-29 MED ORDER — DIPHENHYDRAMINE HCL 25 MG PO CAPS
25.0000 mg | ORAL_CAPSULE | Freq: Once | ORAL | Status: AC
  Administered 2014-10-29: 25 mg via ORAL
  Filled 2014-10-29: qty 1

## 2014-10-29 MED ORDER — FAMOTIDINE 20 MG PO TABS
20.0000 mg | ORAL_TABLET | Freq: Once | ORAL | Status: AC
  Administered 2014-10-29: 20 mg via ORAL
  Filled 2014-10-29: qty 1

## 2014-10-29 NOTE — ED Notes (Signed)
Pt c/o sore itchy throat x few days with symptoms worsening today.  Pt also c/o cough productive of clear sputum.  Pt denies fever.

## 2014-10-29 NOTE — ED Notes (Signed)
Patient reports he has a sore throat, accompanied by productive cough.  Reports he feels short of breath at times.  VSS

## 2014-10-29 NOTE — ED Provider Notes (Signed)
CSN: [id number]     Arrival date & time 10/29/14  1914 History   First MD Initiated Contact with Patient 10/29/14 [phone]     Chief Complaint  Patient presents with  . Sore Throat      HPI  Patient position evaluation difficult to swallowing. He states he's had was itching in the posterior aspect of social last 2 days. This morning felt like he lump in the back. It was difficult to swallow. No difficulty breathing. Normal voice. No fevers bodyaches cough or difficulty breathing.  Past Medical History  Diagnosis Date  . Hypertension   . Hyperchloremia   . Acid reflux   . Seasonal allergies   . Asthma   . Hyperlipidemia   . Restless leg syndrome   . Spinal stenosis    Past Surgical History  Procedure Laterality Date  . Replacement total knee    . Lumbar disc surgery    . Gastric resection    . Back surgery     No family history on file. History  Substance Use Topics  . Smoking status: Former Smoker    Quit date: 07/30/1997  . Smokeless tobacco: Never Used  . Alcohol Use: No    Review of Systems  Constitutional: Negative for fever, chills, diaphoresis, appetite change and fatigue.  HENT: Positive for sore throat and trouble swallowing. Negative for mouth sores.   Eyes: Negative for visual disturbance.  Respiratory: Negative for cough, chest tightness, shortness of breath and wheezing.   Cardiovascular: Negative for chest pain.  Gastrointestinal: Negative for nausea, vomiting, abdominal pain, diarrhea and abdominal distention.  Endocrine: Negative for polydipsia, polyphagia and polyuria.  Genitourinary: Negative for dysuria, frequency and hematuria.  Musculoskeletal: Negative for gait problem.  Skin: Negative for color change, pallor and rash.  Neurological: Negative for dizziness, syncope, light-headedness and headaches.  Hematological: Does not bruise/bleed easily.  Psychiatric/Behavioral: Negative for behavioral problems and confusion.      Allergies  Review of  patient's allergies indicates no known allergies.  Home Medications   Prior to Admission medications   Medication Sig Start Date End Date Taking? Authorizing Provider  albuterol (PROVENTIL HFA;VENTOLIN HFA) 108 (90 BASE) MCG/ACT inhaler Inhale 2 puffs into the lungs every 6 (six) hours as needed for wheezing or shortness of breath.   Yes Historical Provider, MD  Cetirizine HCl 10 MG CAPS Take 10 mg by mouth daily.    Yes Historical Provider, MD  cyclobenzaprine (FLEXERIL) 10 MG tablet Take 10 mg by mouth 3 (three) times daily as needed for muscle spasms.    Yes Historical Provider, MD  doxazosin (CARDURA) 8 MG tablet Take 8 mg by mouth every morning.    Yes Historical Provider, MD  Fluticasone-Salmeterol (ADVAIR) 250-50 MCG/DOSE AEPB Inhale 1 puff into the lungs 2 (two) times daily as needed (wheezing).    Yes Historical Provider, MD  hydrALAZINE (APRESOLINE) 25 MG tablet Take 25 mg by mouth 3 (three) times daily.   Yes Historical Provider, MD  lisinopril (PRINIVIL,ZESTRIL) 10 MG tablet Take 10 mg by mouth daily.   Yes Historical Provider, MD  metFORMIN (GLUCOPHAGE) 500 MG tablet Take 500 mg by mouth 2 (two) times daily with a meal.   Yes Historical Provider, MD  naproxen (NAPROSYN) 500 MG tablet Take 500 mg by mouth 2 (two) times daily with a meal.   Yes Historical Provider, MD  oxymetazoline (AFRIN) 0.05 % nasal spray Place 2 sprays into the nose 2 (two) times daily as needed for congestion.  Yes Historical Provider, MD  pravastatin (PRAVACHOL) 20 MG tablet Take 20 mg by mouth daily.     Yes Historical Provider, MD  atropine 1 % ophthalmic solution Place 1 drop into the left eye 3 (three) times daily. Patient not taking: Reported on 08/30/2014 08/21/14   Junius Creamer, NP  diazepam (VALIUM) 5 MG tablet Take 1 tablet (5 mg total) by mouth 2 (two) times daily. Patient not taking: Reported on 08/21/2014 09/13/13   Carman Ching, PA-C  diphenhydrAMINE (BENADRYL) 25 MG tablet Take 1 tablet (25 mg total)  by mouth every 6 (six) hours. 10/29/14   Tanna Furry, MD  HYDROcodone-acetaminophen (NORCO/VICODIN) 5-325 MG per tablet Take 1-2 tablets by mouth every 4 (four) hours as needed. Patient not taking: Reported on 08/21/2014 09/13/13   Carman Ching, PA-C  omeprazole (PRILOSEC) 20 MG capsule Take 1 capsule (20 mg total) by mouth daily. Patient not taking: Reported on 10/29/2014 08/30/14   Comer Locket, PA-C  predniSONE (DELTASONE) 20 MG tablet Take 1 tablet (20 mg total) by mouth 2 (two) times daily with a meal. 10/29/14   Tanna Furry, MD  trimethoprim-polymyxin b (POLYTRIM) ophthalmic solution Place 1 drop into the left eye every 4 (four) hours. Patient not taking: Reported on 08/30/2014 08/21/14   Junius Creamer, NP   BP 133/86 mmHg  Pulse 83  Temp(Src) 98.3 F (36.8 C) (Oral)  Ht 5\' 7"  (1.702 m)  Wt 281 lb (127.461 kg)  BMI 44.00 kg/m2  SpO2 97% Physical Exam  Constitutional: He is oriented to person, place, and time. He appears well-developed and well-nourished. No distress.  HENT:  Head: Normocephalic.  No lip mouth or tongue swelling. He has minimal uvular edema. No deviation. Still midline. Tonsillar pillars symmetric.  Eyes: Conjunctivae are normal. Pupils are equal, round, and reactive to light. No scleral icterus.  Neck: Normal range of motion. Neck supple. No thyromegaly present.  Cardiovascular: Normal rate and regular rhythm.  Exam reveals no gallop and no friction rub.   No murmur heard. Pulmonary/Chest: Effort normal and breath sounds normal. No respiratory distress. He has no wheezes. He has no rales.  Abdominal: Soft. Bowel sounds are normal. He exhibits no distension. There is no tenderness. There is no rebound.  Musculoskeletal: Normal range of motion.  Neurological: He is alert and oriented to person, place, and time.  Skin: Skin is warm and dry. No rash noted.  Psychiatric: He has a normal mood and affect. His behavior is normal.    ED Course  Procedures (including critical  care time) Labs Review Labs Reviewed  RAPID STREP SCREEN (NOT AT [facility])  CULTURE, GROUP A STREP    Imaging Review Dg Chest 2 View (if Patient Has Fever And/or Copd)  10/29/2014   CLINICAL DATA:  Cough congestion and dyspnea.  Duration 2 days.  EXAM: CHEST  2 VIEW  COMPARISON:  01/02/2013  FINDINGS: There is a left lower lobe calcified granuloma. The lungs are otherwise clear. There are no pleural effusions. Pulmonary vasculature is normal. Heart size is unchanged. Hilar and mediastinal contours are unremarkable unchanged.  IMPRESSION: No acute cardiopulmonary findings.   Electronically Signed   By: Andreas Newport M.D.   On: 10/29/2014 06:58     EKG Interpretation None      MDM   Final diagnoses:  Uvular edema    Patient does take lisinopril. This does not have classic appearance for angioedema. However have asked him to stop this. Given by mouth prednisone Benadryl Pepcid. Rechecked  after 90 minutes. States his symptoms felt completely resolved. He had improvement of the uvular edema. He's able lay supine and does not appear apprehensive. Normal voice. Not stridorous. Plan is discharge home. 48 hours Benadryl prednisone. Return if any worsening. Stop lisinopril.  PCP follow-up for pressure check and medication adjustment.    Tanna Furry, MD 10/29/14 [phone]

## 2014-10-29 NOTE — Discharge Instructions (Signed)
Stop Lisinopril  Return to ER with any worsening at home.

## 2014-11-02 LAB — CULTURE, GROUP A STREP: Strep A Culture: NEGATIVE

## 2015-02-14 ENCOUNTER — Encounter (HOSPITAL_COMMUNITY): Payer: Self-pay | Admitting: *Deleted

## 2015-02-14 ENCOUNTER — Emergency Department (HOSPITAL_COMMUNITY): Payer: Medicaid Other

## 2015-02-14 ENCOUNTER — Observation Stay (HOSPITAL_COMMUNITY)
Admission: EM | Admit: 2015-02-14 | Discharge: 2015-02-15 | Disposition: A | Discharge status: Home / Self Care | Payer: Medicaid Other | Attending: Internal Medicine | Admitting: Internal Medicine

## 2015-02-14 DIAGNOSIS — J45909 Unspecified asthma, uncomplicated: Secondary | ICD-10-CM | POA: Diagnosis not present

## 2015-02-14 DIAGNOSIS — E119 Type 2 diabetes mellitus without complications: Secondary | ICD-10-CM

## 2015-02-14 DIAGNOSIS — G2581 Restless legs syndrome: Secondary | ICD-10-CM | POA: Diagnosis not present

## 2015-02-14 DIAGNOSIS — I10 Essential (primary) hypertension: Secondary | ICD-10-CM | POA: Diagnosis not present

## 2015-02-14 DIAGNOSIS — R079 Chest pain, unspecified: Secondary | ICD-10-CM | POA: Diagnosis present

## 2015-02-14 DIAGNOSIS — E785 Hyperlipidemia, unspecified: Secondary | ICD-10-CM | POA: Diagnosis not present

## 2015-02-14 DIAGNOSIS — Z87891 Personal history of nicotine dependence: Secondary | ICD-10-CM | POA: Diagnosis not present

## 2015-02-14 DIAGNOSIS — R0789 Other chest pain: Secondary | ICD-10-CM | POA: Diagnosis not present

## 2015-02-14 DIAGNOSIS — R42 Dizziness and giddiness: Secondary | ICD-10-CM | POA: Insufficient documentation

## 2015-02-14 DIAGNOSIS — K219 Gastro-esophageal reflux disease without esophagitis: Secondary | ICD-10-CM | POA: Diagnosis not present

## 2015-02-14 DIAGNOSIS — E7800 Pure hypercholesterolemia, unspecified: Secondary | ICD-10-CM | POA: Insufficient documentation

## 2015-02-14 DIAGNOSIS — R519 Headache, unspecified: Secondary | ICD-10-CM

## 2015-02-14 DIAGNOSIS — Z7984 Long term (current) use of oral hypoglycemic drugs: Secondary | ICD-10-CM | POA: Insufficient documentation

## 2015-02-14 DIAGNOSIS — R51 Headache: Secondary | ICD-10-CM | POA: Insufficient documentation

## 2015-02-14 LAB — BASIC METABOLIC PANEL
Anion gap: 6 (ref 5–15)
BUN: 19 mg/dL (ref 6–20)
CALCIUM: 9.3 mg/dL (ref 8.9–10.3)
CO2: 28 mmol/L (ref 22–32)
CREATININE: 1.05 mg/dL (ref 0.61–1.24)
Chloride: 107 mmol/L (ref 101–111)
GFR calc Af Amer: >60 mL/min (ref >60)
Glucose, Bld: 99 mg/dL (ref 65–99)
Potassium: 4.1 mmol/L (ref 3.5–5.1)
SODIUM: 141 mmol/L (ref 135–145)

## 2015-02-14 LAB — CBC
HEMATOCRIT: 38.1 % — AB (ref 39.0–52.0)
Hemoglobin: 12.7 g/dL — ABNORMAL LOW (ref 13.0–17.0)
MCH: 28 pg (ref 26.0–34.0)
MCHC: 33.3 g/dL (ref 30.0–36.0)
MCV: 83.9 fL (ref 78.0–100.0)
Platelets: 213 10*3/uL (ref 150–400)
RBC: 4.54 MIL/uL (ref 4.22–5.81)
RDW: 13.4 % (ref 11.5–15.5)
WBC: 5 10*3/uL (ref 4.0–10.5)

## 2015-02-14 LAB — TROPONIN I
Troponin I: <0.03 ng/mL (ref <0.031)
Troponin I: <0.03 ng/mL (ref <0.031)
Troponin I: <0.03 ng/mL (ref <0.031)

## 2015-02-14 LAB — I-STAT TROPONIN, ED: Troponin i, poc: 0 ng/mL (ref 0.00–0.08)

## 2015-02-14 LAB — GLUCOSE, CAPILLARY: GLUCOSE-CAPILLARY: 128 mg/dL — AB (ref 65–99)

## 2015-02-14 MED ORDER — SODIUM CHLORIDE 0.9 % IV BOLUS (SEPSIS)
500.0000 mL | Freq: Once | INTRAVENOUS | Status: AC
  Administered 2015-02-14: 13:00:00 via INTRAVENOUS

## 2015-02-14 MED ORDER — PANTOPRAZOLE SODIUM 40 MG PO TBEC
40.0000 mg | DELAYED_RELEASE_TABLET | Freq: Every day | ORAL | Status: DC
  Administered 2015-02-14 – 2015-02-15 (×2): 40 mg via ORAL
  Filled 2015-02-14 (×2): qty 1

## 2015-02-14 MED ORDER — LISINOPRIL 10 MG PO TABS: 10.0000 mg | ORAL_TABLET | Freq: Every day | ORAL | Status: DC

## 2015-02-14 MED ORDER — MORPHINE SULFATE (PF) 2 MG/ML IV SOLN: 2.0000 mg | INTRAVENOUS | Status: DC | PRN

## 2015-02-14 MED ORDER — INSULIN ASPART 100 UNIT/ML ~~LOC~~ SOLN: 0.0000 [IU] | Freq: Every day | SUBCUTANEOUS | Status: DC

## 2015-02-14 MED ORDER — ASPIRIN 81 MG PO CHEW
81.0000 mg | CHEWABLE_TABLET | Freq: Every day | ORAL | Status: DC
  Administered 2015-02-14 – 2015-02-15 (×2): 81 mg via ORAL
  Filled 2015-02-14 (×2): qty 1

## 2015-02-14 MED ORDER — GI COCKTAIL ~~LOC~~: 30.0000 mL | Freq: Four times a day (QID) | ORAL | Status: DC | PRN

## 2015-02-14 MED ORDER — NITROGLYCERIN 0.4 MG SL SUBL: 0.4000 mg | SUBLINGUAL_TABLET | SUBLINGUAL | Status: DC | PRN

## 2015-02-14 MED ORDER — PRAMIPEXOLE DIHYDROCHLORIDE 0.25 MG PO TABS
0.2500 mg | ORAL_TABLET | Freq: Every day | ORAL | Status: DC
  Administered 2015-02-14: 0.25 mg via ORAL
  Filled 2015-02-14 (×2): qty 1

## 2015-02-14 MED ORDER — MORPHINE SULFATE (PF) 4 MG/ML IV SOLN
4.0000 mg | Freq: Once | INTRAVENOUS | Status: AC
  Administered 2015-02-14: 4 mg via INTRAVENOUS
  Filled 2015-02-14: qty 1

## 2015-02-14 MED ORDER — ONDANSETRON HCL 4 MG/2ML IJ SOLN: 4.0000 mg | Freq: Four times a day (QID) | INTRAMUSCULAR | Status: DC | PRN

## 2015-02-14 MED ORDER — INFLUENZA VAC SPLIT QUAD 0.5 ML IM SUSY
0.5000 mL | PREFILLED_SYRINGE | INTRAMUSCULAR | Status: DC
  Filled 2015-02-14 (×2): qty 0.5

## 2015-02-14 MED ORDER — INSULIN ASPART 100 UNIT/ML ~~LOC~~ SOLN
0.0000 [IU] | Freq: Three times a day (TID) | SUBCUTANEOUS | Status: DC
Start: 2015-02-14 — End: 2015-02-15
  Administered 2015-02-14: 2 [IU] via SUBCUTANEOUS

## 2015-02-14 MED ORDER — PRAVASTATIN SODIUM 20 MG PO TABS
20.0000 mg | ORAL_TABLET | Freq: Every day | ORAL | Status: DC
  Administered 2015-02-15: 20 mg via ORAL
  Filled 2015-02-14: qty 1

## 2015-02-14 MED ORDER — ASPIRIN 81 MG PO CHEW
324.0000 mg | CHEWABLE_TABLET | Freq: Once | ORAL | Status: AC
  Administered 2015-02-14: 324 mg via ORAL
  Filled 2015-02-14: qty 4

## 2015-02-14 MED ORDER — ALBUTEROL SULFATE (2.5 MG/3ML) 0.083% IN NEBU: 3.0000 mL | INHALATION_SOLUTION | Freq: Four times a day (QID) | RESPIRATORY_TRACT | Status: DC | PRN

## 2015-02-14 MED ORDER — MOMETASONE FURO-FORMOTEROL FUM 100-5 MCG/ACT IN AERO
2.0000 | INHALATION_SPRAY | Freq: Two times a day (BID) | RESPIRATORY_TRACT | Status: DC
  Administered 2015-02-14 – 2015-02-15 (×2): 2 via RESPIRATORY_TRACT
  Filled 2015-02-14: qty 8.8

## 2015-02-14 MED ORDER — ENOXAPARIN SODIUM 40 MG/0.4ML ~~LOC~~ SOLN
40.0000 mg | SUBCUTANEOUS | Status: DC
  Administered 2015-02-14: 40 mg via SUBCUTANEOUS
  Filled 2015-02-14: qty 0.4

## 2015-02-14 MED ORDER — ACETAMINOPHEN 325 MG PO TABS
650.0000 mg | ORAL_TABLET | Freq: Four times a day (QID) | ORAL | Status: DC | PRN
  Administered 2015-02-14 – 2015-02-15 (×2): 650 mg via ORAL
  Filled 2015-02-14 (×2): qty 2

## 2015-02-14 MED ORDER — DOXAZOSIN MESYLATE 4 MG PO TABS
8.0000 mg | ORAL_TABLET | Freq: Every morning | ORAL | Status: DC
  Administered 2015-02-15: 8 mg via ORAL
  Filled 2015-02-14: qty 2

## 2015-02-14 MED ORDER — HYDRALAZINE HCL 25 MG PO TABS
25.0000 mg | ORAL_TABLET | Freq: Three times a day (TID) | ORAL | Status: DC
  Administered 2015-02-14 – 2015-02-15 (×2): 25 mg via ORAL
  Filled 2015-02-14 (×3): qty 1

## 2015-02-14 MED ORDER — ONDANSETRON HCL 4 MG/2ML IJ SOLN
4.0000 mg | Freq: Once | INTRAMUSCULAR | Status: AC
  Administered 2015-02-14: 4 mg via INTRAVENOUS
  Filled 2015-02-14: qty 2

## 2015-02-14 NOTE — ED Provider Notes (Signed)
CSN: [id number]     Arrival date & time 02/14/15  1114 History   First MD Initiated Contact with Patient 02/14/15 1145     Chief Complaint  Patient presents with  . Chest Pain     (Consider location/radiation/quality/duration/timing/severity/associated sxs/prior Treatment) HPI   Blood pressure 139/88, pulse 74, temperature 97.8 F (36.6 C), temperature source Oral, resp. rate 18, SpO2 94 %.  [name] is a 59 y.o. male acute onset of severe frontal headache, bilateral anterior chest pain described as heavy, 8 out of 10 at onset, lasting approximately 1.5-2 hours completely resolved. Patient cannot identify any exacerbating or alleviating symptoms. This is associated with lightheadedness, shortness of breath, diaphoresis. Patient denies nausea, vomiting, syncope, similar prior exacerbations, states that this is not like his prior anxiety attacks. Patient is diabetic, hypertension, hyperlipidemia, former smoker with 60 pack year history (patient stopped smoking in 1999. He has a positive family history of ACS with mother having 2 heart attacks in her elderly years. Patient has never had a headache like the one this morning, states that it was thunderclap in onset, 10 out of 10, woke him from sleep, 8 out of 10 now. No pain medication taken prior to arrival. Patient denies change in vision, dysarthria, ataxia, unilateral weakness, change in sensation, abdominal pain, change in bowel or bladder habits, fever, chills, cervicalgia.    Past Medical History  Diagnosis Date  . Hypertension   . Hyperchloremia   . Acid reflux   . Seasonal allergies   . Asthma   . Hyperlipidemia   . Restless leg syndrome   . Spinal stenosis    Past Surgical History  Procedure Laterality Date  . Replacement total knee    . Lumbar disc surgery    . Gastric resection    . Back surgery     No family history on file. Social History  Substance Use Topics  . Smoking status: Former Smoker    Quit date:  07/30/1997  . Smokeless tobacco: Never Used  . Alcohol Use: No    Review of Systems  10 systems reviewed and found to be negative, except as noted in the HPI.   Allergies  Review of patient's allergies indicates no known allergies.  Home Medications   Prior to Admission medications   Medication Sig Start Date End Date Taking? Authorizing Provider  albuterol (PROVENTIL HFA;VENTOLIN HFA) 108 (90 BASE) MCG/ACT inhaler Inhale 2 puffs into the lungs every 6 (six) hours as needed for wheezing or shortness of breath.   Yes Historical Provider, MD  Cetirizine HCl 10 MG CAPS Take 10 mg by mouth daily.    Yes Historical Provider, MD  cyclobenzaprine (FLEXERIL) 10 MG tablet Take 10 mg by mouth 3 (three) times daily as needed for muscle spasms.    Yes Historical Provider, MD  diclofenac (VOLTAREN) 75 MG EC tablet Take 1 tablet by mouth 2 (two) times daily. 12/24/14  Yes Historical Provider, MD  doxazosin (CARDURA) 8 MG tablet Take 8 mg by mouth every morning.    Yes Historical Provider, MD  Fluticasone-Salmeterol (ADVAIR) 250-50 MCG/DOSE AEPB Inhale 1 puff into the lungs 2 (two) times daily as needed (wheezing).    Yes Historical Provider, MD  hydrALAZINE (APRESOLINE) 25 MG tablet Take 25 mg by mouth 3 (three) times daily.   Yes Historical Provider, MD  lisinopril (PRINIVIL,ZESTRIL) 10 MG tablet Take 10 mg by mouth daily.   Yes Historical Provider, MD  metFORMIN (GLUCOPHAGE) 500 MG tablet Take 500 mg  by mouth 2 (two) times daily with a meal.   Yes Historical Provider, MD  oxymetazoline (AFRIN) 0.05 % nasal spray Place 2 sprays into the nose 2 (two) times daily as needed for congestion.   Yes Historical Provider, MD  pantoprazole (PROTONIX) 40 MG tablet Take 1 tablet by mouth daily. 02/13/15  Yes Historical Provider, MD  pramipexole (MIRAPEX) 0.25 MG tablet Take 0.25 mg by mouth at bedtime. 12/02/14  Yes Historical Provider, MD  pravastatin (PRAVACHOL) 20 MG tablet Take 20 mg by mouth daily.     Yes  Historical Provider, MD  atropine 1 % ophthalmic solution Place 1 drop into the left eye 3 (three) times daily. Patient not taking: Reported on 08/30/2014 08/21/14   Junius Creamer, NP  diazepam (VALIUM) 5 MG tablet Take 1 tablet (5 mg total) by mouth 2 (two) times daily. Patient not taking: Reported on 08/21/2014 09/13/13   Carman Ching, PA-C  diphenhydrAMINE (BENADRYL) 25 MG tablet Take 1 tablet (25 mg total) by mouth every 6 (six) hours. Patient not taking: Reported on 02/14/2015 10/29/14   Tanna Furry, MD  HYDROcodone-acetaminophen (NORCO/VICODIN) 5-325 MG per tablet Take 1-2 tablets by mouth every 4 (four) hours as needed. Patient not taking: Reported on 08/21/2014 09/13/13   Carman Ching, PA-C  omeprazole (PRILOSEC) 20 MG capsule Take 1 capsule (20 mg total) by mouth daily. Patient not taking: Reported on 10/29/2014 08/30/14   Comer Locket, PA-C  predniSONE (DELTASONE) 20 MG tablet Take 1 tablet (20 mg total) by mouth 2 (two) times daily with a meal. Patient not taking: Reported on 02/14/2015 10/29/14   Tanna Furry, MD  trimethoprim-polymyxin b (POLYTRIM) ophthalmic solution Place 1 drop into the left eye every 4 (four) hours. Patient not taking: Reported on 08/30/2014 08/21/14   Junius Creamer, NP   BP 115/78 mmHg  Pulse 70  Temp(Src) 97.8 F (36.6 C) (Oral)  Resp 15  SpO2 94% Physical Exam  Constitutional: He is oriented to person, place, and time. He appears well-developed and well-nourished. No distress.  Obese  HENT:  Head: Normocephalic and atraumatic.  Mouth/Throat: Oropharynx is clear and moist.  Eyes: Conjunctivae and EOM are normal. Pupils are equal, round, and reactive to light.  No TTP of maxillary or frontal sinuses  No TTP or induration of temporal arteries bilaterally  Neck: Normal range of motion. Neck supple. No JVD present. No tracheal deviation present.  FROM to C-spine. Pt can touch chin to chest without discomfort. No TTP of midline cervical spine.   Cardiovascular:  Normal rate, regular rhythm and intact distal pulses.   Radial pulse equal bilaterally  Pulmonary/Chest: Effort normal and breath sounds normal. No stridor. No respiratory distress. He has no wheezes. He has no rales. He exhibits no tenderness.  Abdominal: Soft. Bowel sounds are normal. He exhibits no distension and no mass. There is no tenderness. There is no rebound and no guarding.  Musculoskeletal: Normal range of motion. He exhibits no edema or tenderness.  No calf asymmetry, superficial collaterals, palpable cords, edema, Homans sign negative bilaterally.    Neurological: He is alert and oriented to person, place, and time. No cranial nerve deficit.  II-Visual fields grossly intact. III/IV/VI-Extraocular movements intact.  Pupils reactive bilaterally. V/VII-Smile symmetric, equal eyebrow raise,  facial sensation intact VIII- Hearing grossly intact IX/X-Normal gag XI-bilateral shoulder shrug XII-midline tongue extension Motor: 5/5 bilaterally with normal tone and bulk Cerebellar: Normal finger-to-nose  and normal heel-to-shin test.       Skin: Skin is  warm. He is not diaphoretic.  Psychiatric: He has a normal mood and affect.  Nursing note and vitals reviewed.   ED Course  Procedures (including critical care time) Labs Review Labs Reviewed  CBC - Abnormal; Notable for the following:    Hemoglobin 12.7 (*)    HCT 38.1 (*)    All other components within normal limits  BASIC METABOLIC PANEL  I-STAT TROPOININ, ED    Imaging Review Dg Chest 2 View  02/14/2015  CLINICAL DATA:  Acute mid chest pain EXAM: CHEST  2 VIEW COMPARISON:  10/29/2014 FINDINGS: Persistent low lung volumes. Stable calcified left lower lobe granuloma. No superimposed pneumonia, collapse or consolidation. Negative for edema, effusion, or pneumothorax. Trachea midline. Degenerative changes of the spine. IMPRESSION: Stable low lung volumes.  No interval change or acute process. Electronically Signed   By: Jerilynn Mages.   Shick M.D.   On: 02/14/2015 12:19   Ct Head Wo Contrast  02/14/2015  CLINICAL DATA:  Headache, dizziness beginning this morning EXAM: CT HEAD WITHOUT CONTRAST TECHNIQUE: Contiguous axial images were obtained from the base of the skull through the vertex without intravenous contrast. COMPARISON:  10/23/2008 FINDINGS: No acute intracranial abnormality. Specifically, no hemorrhage, hydrocephalus, mass lesion, acute infarction, or significant intracranial injury. No acute calvarial abnormality. Visualized paranasal sinuses and mastoids clear. Orbital soft tissues unremarkable. IMPRESSION: Negative. Electronically Signed   By: Rolm Baptise M.D.   On: 02/14/2015 13:36   I have personally reviewed and evaluated these images and lab results as part of my medical decision-making.   EKG Interpretation   Date/Time:  Friday February 14 2015 11:31:28 EDT Ventricular Rate:  79 PR Interval:  187 QRS Duration: 97 QT Interval:  386 QTC Calculation: 442 R Axis:   55 Text Interpretation:  Sinus rhythm Borderline low voltage, extremity leads  Otherwise within normal limits Confirmed by POLLINA  MD, CHRISTOPHER  [phone]) on 02/14/2015 11:35:53 AM      MDM   Final diagnoses:  Chest pain, unspecified chest pain type  Acute nonintractable headache, unspecified headache type    Filed Vitals:   02/14/15 1131 02/14/15 1344  BP: 139/88 115/78  Pulse: 74 70  Temp: 97.8 F (36.6 C)   TempSrc: Oral   Resp: 18 15  SpO2: 94% 94%    Medications  aspirin chewable tablet 324 mg (not administered)  sodium chloride 0.9 % bolus 500 mL ( Intravenous New Bag/Given 02/14/15 1231)  morphine 4 MG/ML injection 4 mg (4 mg Intravenous Given 02/14/15 1231)  ondansetron (ZOFRAN) injection 4 mg (4 mg Intravenous Given 02/14/15 1231)    [name] is 59 y.o. male presenting with Concerning story of chest pain radiating to the left arm described as pressure-like, shortness of breath, diaphoresis and lightheaded  sensation with associated severe, thunderclap headache. Neuro exam is nonfocal, head CT which was obtained within 6 hours of symptom onset is negative. Patient has no meningeal signs. EKG with no acute abnormalities, troponin is negative. Patient is chest pain-free on arrival to the ED. He is moderate risk by heart score secondary to obesity, family history, hypertension, hyperlipidemia, diabetes and tobacco use. Will need admission for chest pain rule out. Has never seen a cardiologist.  This is a shared visit with the attending physician who personally evaluated the patient and agrees with the care plan.   Case discussed with [facility] hospitalist Dr. Coralyn Pear who accepts admission  [facility], PA-C 02/14/15 [place], MD 02/14/15 1421

## 2015-02-14 NOTE — ED Notes (Signed)
Called charge nurse March Rummage states pt can come to floor,

## 2015-02-14 NOTE — ED Provider Notes (Signed)
Patient presented to the ER with chest pain. Patient reports left-sided chest pain that radiated to his arm and accompanied with shortness of breath this morning. Patient also complains of headache.  Face to face Exam: HEENT - PERRLA Lungs - CTAB Heart - RRR, no M/R/G Abd - S/NT/ND Neuro - alert, oriented x3  Plan: Initial cardiac evaluation negative, will place in observation for further cardiac eval. CT head normal, neurologic evaluation normal, no further workup necessary for headache.  Orpah Greek, MD 02/14/15 1416

## 2015-02-14 NOTE — ED Notes (Signed)
Pt complains of left sided chest pain radiating to his left arm and shortness of breath since 7AM this morning. Pt states he awoke with the pain. Pt also complains of a headache and dizziness. Pt states pain is constant.

## 2015-02-14 NOTE — H&P (Signed)
[facility] History and Physical  [name] PIR:[id number] DOB: 06-30-1955 DOA: 02/14/2015  Referring physician:  PCP: Colin Fendt, MD   Chief Complaint: Chest Pain  HPI: [name] is a 59 y.o. male with a past medical history of hypertension, dyslipidemia, diabetes mellitus presenting to the emergency department with complaints of chest pain. He reports waking up this morning experiencing chest pain across his chest characterizes tight and pressure-like lasting approximate 10-15 minutes. He also describes having associated numbness coming down his left lower extremity. This was also associate with shortness of breath. He had a second episode of chest pain with same characteristics this morning after which she presented to the emergency department. He denies fevers, chills, nausea, vomiting, cough, sputum production. Initial workup in the emergency department included a troponin that was negative. EKG did not show acute ischemic changes. He denies undergoing previous cardiac workups.                                                                Review of Systems:  Constitutional:  No weight loss, night sweats, Fevers, chills, fatigue.  HEENT:  No headaches, Difficulty swallowing,Tooth/dental problems,Sore throat,  No sneezing, itching, ear ache, nasal congestion, post nasal drip,  Cardio-vascular:  Positive for chest pain, denies Orthopnea, PND, swelling in lower extremities, anasarca, dizziness, palpitations  GI:  No heartburn, indigestion, abdominal pain, nausea, vomiting, diarrhea, change in bowel habits, loss of appetite  Resp:  Positive shortness of breath with exertion or at rest. No excess mucus, no productive cough, No non-productive cough, No coughing up of blood.No change in color of mucus.No wheezing.No chest wall deformity  Skin:  no rash or lesions.  GU:  no dysuria, change in color of urine, no urgency or frequency. No flank pain.  Musculoskeletal:  No  joint pain or swelling. No decreased range of motion. No back pain.  Psych:  No change in mood or affect. No depression or anxiety. No memory loss.   Past Medical History  Diagnosis Date  . Hypertension   . Hyperchloremia   . Acid reflux   . Seasonal allergies   . Asthma   . Hyperlipidemia   . Restless leg syndrome   . Spinal stenosis    Past Surgical History  Procedure Laterality Date  . Replacement total knee    . Lumbar disc surgery    . Gastric resection    . Back surgery     Social History:  reports that he quit smoking about 17 years ago. He has never used smokeless tobacco. He reports that he does not drink alcohol or use illicit drugs.  No Known Allergies  Family History  Problem Relation Age of Onset  . Heart attack Mother     hx 2 heart attacks    Prior to Admission medications   Medication Sig Start Date End Date Taking? Authorizing Provider  albuterol (PROVENTIL HFA;VENTOLIN HFA) 108 (90 BASE) MCG/ACT inhaler Inhale 2 puffs into the lungs every 6 (six) hours as needed for wheezing or shortness of breath.   Yes Historical Provider, MD  Cetirizine HCl 10 MG CAPS Take 10 mg by mouth daily.    Yes Historical Provider, MD  cyclobenzaprine (FLEXERIL) 10 MG tablet Take 10 mg by mouth 3 (three) times  daily as needed for muscle spasms.    Yes Historical Provider, MD  diclofenac (VOLTAREN) 75 MG EC tablet Take 1 tablet by mouth 2 (two) times daily. 12/24/14  Yes Historical Provider, MD  doxazosin (CARDURA) 8 MG tablet Take 8 mg by mouth every morning.    Yes Historical Provider, MD  Fluticasone-Salmeterol (ADVAIR) 250-50 MCG/DOSE AEPB Inhale 1 puff into the lungs 2 (two) times daily as needed (wheezing).    Yes Historical Provider, MD  hydrALAZINE (APRESOLINE) 25 MG tablet Take 25 mg by mouth 3 (three) times daily.   Yes Historical Provider, MD  lisinopril (PRINIVIL,ZESTRIL) 10 MG tablet Take 10 mg by mouth daily.   Yes Historical Provider, MD  metFORMIN (GLUCOPHAGE) 500 MG  tablet Take 500 mg by mouth 2 (two) times daily with a meal.   Yes Historical Provider, MD  oxymetazoline (AFRIN) 0.05 % nasal spray Place 2 sprays into the nose 2 (two) times daily as needed for congestion.   Yes Historical Provider, MD  pantoprazole (PROTONIX) 40 MG tablet Take 1 tablet by mouth daily. 02/13/15  Yes Historical Provider, MD  pramipexole (MIRAPEX) 0.25 MG tablet Take 0.25 mg by mouth at bedtime. 12/02/14  Yes Historical Provider, MD  pravastatin (PRAVACHOL) 20 MG tablet Take 20 mg by mouth daily.     Yes Historical Provider, MD  atropine 1 % ophthalmic solution Place 1 drop into the left eye 3 (three) times daily. Patient not taking: Reported on 08/30/2014 08/21/14   Junius Creamer, NP  diazepam (VALIUM) 5 MG tablet Take 1 tablet (5 mg total) by mouth 2 (two) times daily. Patient not taking: Reported on 08/21/2014 09/13/13   Carman Ching, PA-C  diphenhydrAMINE (BENADRYL) 25 MG tablet Take 1 tablet (25 mg total) by mouth every 6 (six) hours. Patient not taking: Reported on 02/14/2015 10/29/14   Tanna Furry, MD  HYDROcodone-acetaminophen (NORCO/VICODIN) 5-325 MG per tablet Take 1-2 tablets by mouth every 4 (four) hours as needed. Patient not taking: Reported on 08/21/2014 09/13/13   Carman Ching, PA-C  omeprazole (PRILOSEC) 20 MG capsule Take 1 capsule (20 mg total) by mouth daily. Patient not taking: Reported on 10/29/2014 08/30/14   Comer Locket, PA-C  predniSONE (DELTASONE) 20 MG tablet Take 1 tablet (20 mg total) by mouth 2 (two) times daily with a meal. Patient not taking: Reported on 02/14/2015 10/29/14   Tanna Furry, MD  trimethoprim-polymyxin b (POLYTRIM) ophthalmic solution Place 1 drop into the left eye every 4 (four) hours. Patient not taking: Reported on 08/30/2014 08/21/14   Junius Creamer, NP   Physical Exam: Filed Vitals:   02/14/15 1131 02/14/15 1344  BP: 139/88 115/78  Pulse: 74 70  Temp: 97.8 F (36.6 C)   TempSrc: Oral   Resp: 18 15  SpO2: 94% 94%    Wt Readings from  Last 3 Encounters:  10/29/14 127.461 kg (281 lb)  03/15/13 128.822 kg (284 lb)  01/11/13 127.007 kg (280 lb)    General:  Appears calm and comfortable, anxious appearing though no acute distress. Currently chest pain-free Eyes: PERRL, normal lids, irises & conjunctiva ENT: grossly normal hearing, lips & tongue Neck: no LAD, masses or thyromegaly Cardiovascular: RRR, no m/r/g. No LE edema. Telemetry: SR, no arrhythmias  Respiratory: CTA bilaterally, no w/r/r. Normal respiratory effort. Abdomen: soft, ntnd Skin: no rash or induration seen on limited exam Musculoskeletal: grossly normal tone BUE/BLE Psychiatric: grossly normal mood and affect, speech fluent and appropriate Neurologic: grossly non-focal.  Labs on Admission:  Basic Metabolic Panel:  Recent Labs Lab 02/14/15 1217  NA 141  K 4.1  CL 107  CO2 28  GLUCOSE 99  BUN 19  CREATININE 1.05  CALCIUM 9.3   Liver Function Tests: No results for input(s): AST, ALT, ALKPHOS, BILITOT, PROT, ALBUMIN in the last 168 hours. No results for input(s): LIPASE, AMYLASE in the last 168 hours. No results for input(s): AMMONIA in the last 168 hours. CBC:  Recent Labs Lab 02/14/15 1217  WBC 5.0  HGB 12.7*  HCT 38.1*  MCV 83.9  PLT 213   Cardiac Enzymes: No results for input(s): CKTOTAL, CKMB, CKMBINDEX, TROPONINI in the last 168 hours.  BNP (last 3 results) No results for input(s): BNP in the last 8760 hours.  ProBNP (last 3 results) No results for input(s): PROBNP in the last 8760 hours.  CBG: No results for input(s): GLUCAP in the last 168 hours.  Radiological Exams on Admission: Dg Chest 2 View  02/14/2015  CLINICAL DATA:  Acute mid chest pain EXAM: CHEST  2 VIEW COMPARISON:  10/29/2014 FINDINGS: Persistent low lung volumes. Stable calcified left lower lobe granuloma. No superimposed pneumonia, collapse or consolidation. Negative for edema, effusion, or pneumothorax. Trachea midline. Degenerative changes of  the spine. IMPRESSION: Stable low lung volumes.  No interval change or acute process. Electronically Signed   By: Jerilynn Mages.  Shick M.D.   On: 02/14/2015 12:19   Ct Head Wo Contrast  02/14/2015  CLINICAL DATA:  Headache, dizziness beginning this morning EXAM: CT HEAD WITHOUT CONTRAST TECHNIQUE: Contiguous axial images were obtained from the base of the skull through the vertex without intravenous contrast. COMPARISON:  10/23/2008 FINDINGS: No acute intracranial abnormality. Specifically, no hemorrhage, hydrocephalus, mass lesion, acute infarction, or significant intracranial injury. No acute calvarial abnormality. Visualized paranasal sinuses and mastoids clear. Orbital soft tissues unremarkable. IMPRESSION: Negative. Electronically Signed   By: Rolm Baptise M.D.   On: 02/14/2015 13:36    EKG: Independently reviewed. No acute ischemic changes noted  Assessment/Plan Principal Problem:   Chest pain Active Problems:   HYPERCHOLESTEROLEMIA   HTN (hypertension)   Diabetes mellitus ([place])   1. Chest pain. [name] presents with complaints of chest pain mainly having atypical features. He has multiple cardiovascular risk factors however, including hypertension, dyslipidemia, diabetes mellitus. Furthermore has a family history of coronary artery disease. Will place him in overnight observation on continuous cardiac monitoring with cycling of troponins 3. Will continue aspirin, ACE inhibitor, statin. Will have him undergo stress test in a.m. 2. Type 2 diabetes mellitus. Will hold his metformin on admission, provide sliding scale coverage. Plan to make him nothing by mouth after midnight for stress test in a.m. He had a blood sugar of 99 in the emergency department. 3. Dyslipidemia. Will check a fasting lipid panel, meanwhile continue pravastatin 20 mg by mouth daily 4. Hypertension. Will continue hydralazine 25 mg by mouth 3 times a day and lisinopril 10 mg by mouth daily 5. DVT prophylaxis. Lovenox   Code  Status: Full code Family Communication: Spoke to family members present at bedside Disposition Plan: Will place patient in overnight observation do not anticipate him requiring greater than 2 nights hospitalization  Time spent: 55 min  Kelvin Cellar [facility] Pager [phone]

## 2015-02-15 ENCOUNTER — Observation Stay (HOSPITAL_COMMUNITY)
Admit: 2015-02-15 | Discharge: 2015-02-15 | Disposition: A | Discharge status: Home / Self Care | Payer: Medicaid Other | Attending: Internal Medicine | Admitting: Internal Medicine

## 2015-02-15 ENCOUNTER — Ambulatory Visit (HOSPITAL_COMMUNITY)
Admit: 2015-02-15 | Discharge: 2015-02-15 | Disposition: A | Discharge status: Home / Self Care | Payer: No Typology Code available for payment source | Attending: Internal Medicine | Admitting: Internal Medicine

## 2015-02-15 DIAGNOSIS — R079 Chest pain, unspecified: Secondary | ICD-10-CM | POA: Diagnosis not present

## 2015-02-15 DIAGNOSIS — I10 Essential (primary) hypertension: Secondary | ICD-10-CM | POA: Diagnosis not present

## 2015-02-15 DIAGNOSIS — R0789 Other chest pain: Secondary | ICD-10-CM | POA: Diagnosis not present

## 2015-02-15 DIAGNOSIS — E7800 Pure hypercholesterolemia, unspecified: Secondary | ICD-10-CM | POA: Diagnosis not present

## 2015-02-15 LAB — NM MYOCAR MULTI W/SPECT W/WALL MOTION / EF
CHL CUP MPHR: 162 {beats}/min
CSEPEW: 1 METS
CSEPHR: 59 %
CSEPPHR: 94 {beats}/min
Exercise duration (min): 0 min
Exercise duration (sec): 0 s
Rest HR: 69 {beats}/min

## 2015-02-15 LAB — CBC
HCT: 38.1 % — ABNORMAL LOW (ref 39.0–52.0)
Hemoglobin: 12.3 g/dL — ABNORMAL LOW (ref 13.0–17.0)
MCH: 27.3 pg (ref 26.0–34.0)
MCHC: 32.3 g/dL (ref 30.0–36.0)
MCV: 84.7 fL (ref 78.0–100.0)
Platelets: 211 K/uL (ref 150–400)
RBC: 4.5 MIL/uL (ref 4.22–5.81)
RDW: 13.4 % (ref 11.5–15.5)
WBC: 3.7 K/uL — ABNORMAL LOW (ref 4.0–10.5)

## 2015-02-15 LAB — GLUCOSE, CAPILLARY
Glucose-Capillary: 114 mg/dL — ABNORMAL HIGH (ref 65–99)
Glucose-Capillary: 101 mg/dL — ABNORMAL HIGH (ref 65–99)
Glucose-Capillary: 155 mg/dL — ABNORMAL HIGH (ref 65–99)

## 2015-02-15 LAB — BASIC METABOLIC PANEL WITH GFR
Anion gap: 5 (ref 5–15)
BUN: 13 mg/dL (ref 6–20)
CO2: 31 mmol/L (ref 22–32)
Calcium: 9.1 mg/dL (ref 8.9–10.3)
Chloride: 104 mmol/L (ref 101–111)
Creatinine, Ser: 0.91 mg/dL (ref 0.61–1.24)
GFR calc Af Amer: >60 mL/min
GFR calc non Af Amer: >60 mL/min
Glucose, Bld: 107 mg/dL — ABNORMAL HIGH (ref 65–99)
Potassium: 4.1 mmol/L (ref 3.5–5.1)
Sodium: 140 mmol/L (ref 135–145)

## 2015-02-15 LAB — LIPID PANEL
CHOLESTEROL: 144 mg/dL (ref 0–200)
HDL: 27 mg/dL — ABNORMAL LOW (ref >40)
LDL Cholesterol: 71 mg/dL (ref 0–99)
Total CHOL/HDL Ratio: 5.3 RATIO
Triglycerides: 229 mg/dL — ABNORMAL HIGH (ref <150)
VLDL: 46 mg/dL — ABNORMAL HIGH (ref 0–40)

## 2015-02-15 MED ORDER — TECHNETIUM TC 99M SESTAMIBI GENERIC - CARDIOLITE
10.0000 | Freq: Once | INTRAVENOUS | Status: AC | PRN
  Administered 2015-02-15: 10 via INTRAVENOUS

## 2015-02-15 MED ORDER — REGADENOSON 0.4 MG/5ML IV SOLN
INTRAVENOUS | Status: AC
  Filled 2015-02-15: qty 5

## 2015-02-15 MED ORDER — TECHNETIUM TC 99M SESTAMIBI - CARDIOLITE
30.0000 | Freq: Once | INTRAVENOUS | Status: AC | PRN
  Administered 2015-02-15: 30 via INTRAVENOUS

## 2015-02-15 MED ORDER — ASPIRIN 81 MG PO CHEW: 81.0000 mg | CHEWABLE_TABLET | Freq: Every day | ORAL | Status: DC

## 2015-02-15 MED ORDER — REGADENOSON 0.4 MG/5ML IV SOLN
0.4000 mg | Freq: Once | INTRAVENOUS | Status: AC
  Administered 2015-02-15: 0.4 mg via INTRAVENOUS

## 2015-02-15 NOTE — Progress Notes (Signed)
1 day lexiscan myoview completed without significant complication. Note cardiology has not been formally consulted. Pending myoview result this afternoon by [facility] radiology  Signed, Almyra Deforest PA Pager: [phone]

## 2015-02-15 NOTE — Progress Notes (Signed)
Patient made aware of myoview result:  IMPRESSION: 1. No reversible ischemia or infarction.  2. Normal left ventricular wall motion.  3. Left ventricular ejection fraction 60%  4. Low-risk stress test findings*.  Hilbert Corrigan PA Pager: [phone]

## 2015-02-15 NOTE — Discharge Summary (Signed)
Physician Discharge Summary  [name] URK:[id number] DOB: 1955/05/06 DOA: 02/14/2015  PCP: Philis Fendt, MD  Admit date: 02/14/2015 Discharge date: 02/15/2015  Time spent: 35 minutes  Recommendations for Outpatient Follow-up:  1. Patient admitted for chest pain rule out had a negative stress test, struck to follow-up with his PCP in the next 2 weeks 2. Please follow-up on blood pressures   Discharge Diagnoses:  Principal Problem:   Chest pain Active Problems:   HYPERCHOLESTEROLEMIA   HTN (hypertension)   Diabetes mellitus ([place])   Discharge Condition: Stable  Diet recommendation: Heart healthy carbohydrate modified diet  Filed Weights   02/14/15 1509  Weight: 124.7 kg (274 lb 14.6 oz)    History of present illness:  [name] is a 59 y.o. male with a past medical history of hypertension, dyslipidemia, diabetes mellitus presenting to the emergency department with complaints of chest pain. He reports waking up this morning experiencing chest pain across his chest characterizes tight and pressure-like lasting approximate 10-15 minutes. He also describes having associated numbness coming down his left lower extremity. This was also associate with shortness of breath. He had a second episode of chest pain with same characteristics this morning after which she presented to the emergency department. He denies fevers, chills, nausea, vomiting, cough, sputum production. Initial workup in the emergency department included a troponin that was negative. EKG did not show acute ischemic changes. He denies undergoing previous cardiac workups.  Hospital Course:  [name] is a 59 year old gentleman with a past medical history of hypertension, dyslipidemia, diabetes mellitus presented to the emergency department at [facility] on 02/14/2015 with complaints of chest pain. His chest pain had atypical features however he has multiple cardiovascular risk factors for which she was  placed in overnight observation on continuous cardiac monitoring. His troponins remain negative. On the following morning he underwent a stress test that did not show evidence of reversible ischemia or infarction. Left ventricular ejection fraction was 60%. Findings consistent with low risk stress test. I suspect his chest pain may have been related to panic attacks. We'll continue Valium 5 mg by mouth twice a day as needed. He was instructed to follow-up with his primary care physician within 2 weeks.  Procedures:  Lexis scan stress test performed on 02/15/2015 showing no evidence of reversible ischemia.   Discharge Exam: Filed Vitals:   02/15/15 1427  BP: 153/87  Pulse: 83  Temp: 98.5 F (36.9 C)  Resp: 20    General: No acute distress, awake and alert oriented, tolerated his lunch Cardiovascular: Regular rate and rhythm normal S1-S2 no murmurs rubs or gallops Respiratory: Normal respiratory effort, lungs are clear to auscultation bilaterally Abdomen: Soft nontender nontender positive bowel sounds  Discharge Instructions   Discharge Instructions    Call MD for:  difficulty breathing, headache or visual disturbances    Complete by:  As directed      Call MD for:  extreme fatigue    Complete by:  As directed      Call MD for:  hives    Complete by:  As directed      Call MD for:  persistant dizziness or light-headedness    Complete by:  As directed      Call MD for:  persistant nausea and vomiting    Complete by:  As directed      Call MD for:  redness, tenderness, or signs of infection (pain, swelling, redness, odor or green/yellow discharge around incision site)  Complete by:  As directed      Call MD for:  severe uncontrolled pain    Complete by:  As directed      Call MD for:  temperature >100.4    Complete by:  As directed      Call MD for:    Complete by:  As directed      Diet - low sodium heart healthy    Complete by:  As directed      Increase activity slowly     Complete by:  As directed           Current Discharge Medication List    START taking these medications   Details  aspirin 81 MG chewable tablet Chew 1 tablet (81 mg total) by mouth daily. Qty: 30 tablet, Refills: 0      CONTINUE these medications which have NOT CHANGED   Details  albuterol (PROVENTIL HFA;VENTOLIN HFA) 108 (90 BASE) MCG/ACT inhaler Inhale 2 puffs into the lungs every 6 (six) hours as needed for wheezing or shortness of breath.    Cetirizine HCl 10 MG CAPS Take 10 mg by mouth daily.     cyclobenzaprine (FLEXERIL) 10 MG tablet Take 10 mg by mouth 3 (three) times daily as needed for muscle spasms.     diclofenac (VOLTAREN) 75 MG EC tablet Take 1 tablet by mouth 2 (two) times daily. Refills: 0    doxazosin (CARDURA) 8 MG tablet Take 8 mg by mouth every morning.     Fluticasone-Salmeterol (ADVAIR) 250-50 MCG/DOSE AEPB Inhale 1 puff into the lungs 2 (two) times daily as needed (wheezing).     hydrALAZINE (APRESOLINE) 25 MG tablet Take 25 mg by mouth 3 (three) times daily.    metFORMIN (GLUCOPHAGE) 500 MG tablet Take 500 mg by mouth 2 (two) times daily with a meal.    oxymetazoline (AFRIN) 0.05 % nasal spray Place 2 sprays into the nose 2 (two) times daily as needed for congestion.    pantoprazole (PROTONIX) 40 MG tablet Take 1 tablet by mouth daily. Refills: 0    pramipexole (MIRAPEX) 0.25 MG tablet Take 0.25 mg by mouth at bedtime. Refills: 0    pravastatin (PRAVACHOL) 20 MG tablet Take 20 mg by mouth daily.      atropine 1 % ophthalmic solution Place 1 drop into the left eye 3 (three) times daily. Qty: 2 mL, Refills: 12    diazepam (VALIUM) 5 MG tablet Take 1 tablet (5 mg total) by mouth 2 (two) times daily. Qty: 10 tablet, Refills: 0    diphenhydrAMINE (BENADRYL) 25 MG tablet Take 1 tablet (25 mg total) by mouth every 6 (six) hours. Qty: 8 tablet, Refills: 0    HYDROcodone-acetaminophen (NORCO/VICODIN) 5-325 MG per tablet Take 1-2 tablets by mouth  every 4 (four) hours as needed. Qty: 10 tablet, Refills: 0    omeprazole (PRILOSEC) 20 MG capsule Take 1 capsule (20 mg total) by mouth daily. Qty: 20 capsule, Refills: 0      STOP taking these medications     lisinopril (PRINIVIL,ZESTRIL) 10 MG tablet      predniSONE (DELTASONE) 20 MG tablet      trimethoprim-polymyxin b (POLYTRIM) ophthalmic solution        No Known Allergies Follow-up Information    Follow up with AVBUERE,EDWIN A, MD In 2 weeks.   Specialty:  Internal Medicine   Contact information:   [address] [place] [address] [phone]        The results  of significant diagnostics from this hospitalization (including imaging, microbiology, ancillary and laboratory) are listed below for reference.    Significant Diagnostic Studies: Dg Chest 2 View  02/14/2015  CLINICAL DATA:  Acute mid chest pain EXAM: CHEST  2 VIEW COMPARISON:  10/29/2014 FINDINGS: Persistent low lung volumes. Stable calcified left lower lobe granuloma. No superimposed pneumonia, collapse or consolidation. Negative for edema, effusion, or pneumothorax. Trachea midline. Degenerative changes of the spine. IMPRESSION: Stable low lung volumes.  No interval change or acute process. Electronically Signed   By: Jerilynn Mages.  Shick M.D.   On: 02/14/2015 12:19   Ct Head Wo Contrast  02/14/2015  CLINICAL DATA:  Headache, dizziness beginning this morning EXAM: CT HEAD WITHOUT CONTRAST TECHNIQUE: Contiguous axial images were obtained from the base of the skull through the vertex without intravenous contrast. COMPARISON:  10/23/2008 FINDINGS: No acute intracranial abnormality. Specifically, no hemorrhage, hydrocephalus, mass lesion, acute infarction, or significant intracranial injury. No acute calvarial abnormality. Visualized paranasal sinuses and mastoids clear. Orbital soft tissues unremarkable. IMPRESSION: Negative. Electronically Signed   By: Rolm Baptise M.D.   On: 02/14/2015 13:36   Nm Myocar Multi  W/spect W/wall Motion / Ef  02/15/2015  CLINICAL DATA:  Chest pain, left arm numbness, dizziness EXAM: MYOCARDIAL IMAGING WITH SPECT (REST AND PHARMACOLOGIC-STRESS) GATED LEFT VENTRICULAR WALL MOTION STUDY LEFT VENTRICULAR EJECTION FRACTION TECHNIQUE: Standard myocardial SPECT imaging was performed after resting intravenous injection of 10 mCi Tc-38m sestamibi. Subsequently, intravenous infusion of Lexiscan was performed under the supervision of the Cardiology staff. At peak effect of the drug, 30 mCi Tc-8m sestamibi was injected intravenously and standard myocardial SPECT imaging was performed. Quantitative gated imaging was also performed to evaluate left ventricular wall motion, and estimate left ventricular ejection fraction. COMPARISON:  None. FINDINGS: Perfusion: No decreased activity in the left ventricle on stress imaging to suggest reversible ischemia or infarction. Wall Motion: Normal left ventricular wall motion. No left ventricular dilation. Left Ventricular Ejection Fraction: 60 % End diastolic volume 409 ml End systolic volume 52 ml IMPRESSION: 1. No reversible ischemia or infarction. 2. Normal left ventricular wall motion. 3. Left ventricular ejection fraction 60% 4. Low-risk stress test findings*. *2012 Appropriate Use Criteria for Coronary Revascularization Focused Update: J Am Coll Cardiol. 8119;14(7):829-562. http://content.[email or web address].aspx?[email or web address] Electronically Signed   By: Julian Hy M.D.   On: 02/15/2015 13:53    Microbiology: No results found for this or any previous visit (from the past 240 hour(s)).   Labs: Basic Metabolic Panel:  Recent Labs Lab 02/14/15 1217 02/15/15 0542  NA 141 140  K 4.1 4.1  CL 107 104  CO2 28 31  GLUCOSE 99 107*  BUN 19 13  CREATININE 1.05 0.91  CALCIUM 9.3 9.1   Liver Function Tests: No results for input(s): AST, ALT, ALKPHOS, BILITOT, PROT, ALBUMIN in the last 168 hours. No results for input(s): LIPASE, AMYLASE  in the last 168 hours. No results for input(s): AMMONIA in the last 168 hours. CBC:  Recent Labs Lab 02/14/15 1217 02/15/15 0542  WBC 5.0 3.7*  HGB 12.7* 12.3*  HCT 38.1* 38.1*  MCV 83.9 84.7  PLT 213 211   Cardiac Enzymes:  Recent Labs Lab 02/14/15 1616 02/14/15 1820 02/14/15 2220  TROPONINI <0.03 <0.03 <0.03   BNP: BNP (last 3 results) No results for input(s): BNP in the last 8760 hours.  ProBNP (last 3 results) No results for input(s): PROBNP in the last 8760 hours.  CBG:  Recent Labs Lab 02/14/15 1630 02/14/15 2024 02/15/15 0755 02/15/15  [place]       Signed:  Allin Frix  [facility] 02/15/2015, 2:42 PM

## 2016-11-07 IMAGING — NM NM MYOCAR MULTI W/SPECT W/WALL MOTION & EF
1 series · 6 of 6 positions shown · non-contrast
Comparison: None.

CLINICAL DATA: Chest pain, left arm numbness, dizziness

EXAM:
MYOCARDIAL IMAGING WITH SPECT (REST AND PHARMACOLOGIC-STRESS)
GATED LEFT VENTRICULAR WALL MOTION STUDY
LEFT VENTRICULAR EJECTION FRACTION
TECHNIQUE: Standard myocardial SPECT imaging was performed after resting
intravenous injection of 10 mCi Xc-BBm sestamibi. Subsequently,
intravenous infusion of Lexiscan was performed under the supervision
of the Cardiology staff. At peak effect of the drug, 30 mCi Xc-BBm
sestamibi was injected intravenously and standard myocardial SPECT
imaging was performed. Quantitative gated imaging was also performed
to evaluate left ventricular wall motion, and estimate left
ventricular ejection fraction.

[Series 1: rest · 8.28mm/px · 6 of 64 frames shown]
[frame 6/64]
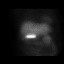
[frame 16/64]
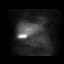
[frame 27/64]
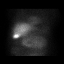
[frame 38/64]
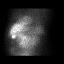
[frame 48/64]
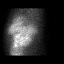
[frame 59/64]
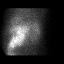

[6 of 6 positions shown; findings below may reference images not displayed]

FINDINGS: Perfusion: No decreased activity in the left ventricle on stress
imaging to suggest reversible ischemia or infarction.

Wall Motion: Normal left ventricular wall motion. No left
ventricular dilation.

Left Ventricular Ejection Fraction: 60 %

End diastolic volume 132 ml

End systolic volume 52 ml
IMPRESSION: 1. No reversible ischemia or infarction.

2. Normal left ventricular wall motion.

3. Left ventricular ejection fraction 60%

4. Low-risk stress test findings*.

*2732 Appropriate Use Criteria for Coronary Revascularization
Focused Update: J Am Coll Cardiol. 2732;59(9):857-881.
[URL]

## 2016-12-04 ENCOUNTER — Encounter (HOSPITAL_COMMUNITY): Payer: Self-pay

## 2016-12-04 ENCOUNTER — Emergency Department (HOSPITAL_COMMUNITY)
Admission: EM | Admit: 2016-12-04 | Discharge: 2016-12-04 | Disposition: A | Discharge status: Home / Self Care | Payer: Medicaid Other | Attending: Emergency Medicine | Admitting: Emergency Medicine

## 2016-12-04 DIAGNOSIS — Z87891 Personal history of nicotine dependence: Secondary | ICD-10-CM | POA: Diagnosis not present

## 2016-12-04 DIAGNOSIS — M79605 Pain in left leg: Secondary | ICD-10-CM

## 2016-12-04 DIAGNOSIS — J45909 Unspecified asthma, uncomplicated: Secondary | ICD-10-CM | POA: Diagnosis not present

## 2016-12-04 DIAGNOSIS — I10 Essential (primary) hypertension: Secondary | ICD-10-CM | POA: Diagnosis not present

## 2016-12-04 DIAGNOSIS — Z7982 Long term (current) use of aspirin: Secondary | ICD-10-CM | POA: Diagnosis not present

## 2016-12-04 DIAGNOSIS — E119 Type 2 diabetes mellitus without complications: Secondary | ICD-10-CM | POA: Insufficient documentation

## 2016-12-04 DIAGNOSIS — Z7901 Long term (current) use of anticoagulants: Secondary | ICD-10-CM | POA: Diagnosis not present

## 2016-12-04 DIAGNOSIS — Z79899 Other long term (current) drug therapy: Secondary | ICD-10-CM | POA: Diagnosis not present

## 2016-12-04 DIAGNOSIS — M5432 Sciatica, left side: Secondary | ICD-10-CM

## 2016-12-04 MED ORDER — PREDNISONE 10 MG PO TABS: 40.0000 mg | ORAL_TABLET | Freq: Every day | ORAL | 0 refills | Status: AC

## 2016-12-04 MED ORDER — LIDOCAINE 5 % EX PTCH: 1.0000 | MEDICATED_PATCH | CUTANEOUS | 0 refills | Status: AC

## 2016-12-04 MED ORDER — OXYCODONE HCL 5 MG PO TABS: 5.0000 mg | ORAL_TABLET | Freq: Four times a day (QID) | ORAL | 0 refills | Status: AC | PRN

## 2016-12-04 MED ORDER — HYDROMORPHONE HCL 1 MG/ML IJ SOLN
1.0000 mg | Freq: Once | INTRAMUSCULAR | Status: AC
  Administered 2016-12-04: 1 mg via INTRAMUSCULAR
  Filled 2016-12-04: qty 1

## 2016-12-04 MED ORDER — PREDNISONE 20 MG PO TABS
60.0000 mg | ORAL_TABLET | Freq: Once | ORAL | Status: AC
  Administered 2016-12-04: 60 mg via ORAL
  Filled 2016-12-04: qty 3

## 2016-12-04 MED ORDER — ACETAMINOPHEN 500 MG PO TABS
1000.0000 mg | ORAL_TABLET | Freq: Once | ORAL | Status: AC
  Administered 2016-12-04: 1000 mg via ORAL
  Filled 2016-12-04: qty 2

## 2016-12-04 NOTE — ED Notes (Signed)
Bed: WTR5 Expected date:  Expected time:  Means of arrival:  Comments: 

## 2016-12-04 NOTE — ED Provider Notes (Signed)
[facility] DEPT Provider Note   CSN: [id number] Arrival date & time: 12/04/16  1554     History   Chief Complaint Chief Complaint  Patient presents with  . Leg Pain    HPI [name] is a 61 y.o. male with history of chronic back pain s/p lumbar laminectomy presents to the ED for evaluation of gradually worsening left buttock, hip and leg pain 3 days. Pain described as "pins and needles", sharp, radiating. Aggravated by sitting down on the left side and laying flat, coughing also makes it worse. He has tried Aleve without benefit. No recent falls, and MVC or direct trauma. No previous history of sciatica. No IV drug use, fevers, abdominal pain, diarrhea, dysuria, groin numbness, weakness or numbness to lower extremities.  HPI  Past Medical History:  Diagnosis Date  . Acid reflux   . Asthma   . Hyperchloremia   . Hyperlipidemia   . Hypertension   . Restless leg syndrome   . Seasonal allergies   . Spinal stenosis     Patient Active Problem List   Diagnosis Date Noted  . Chest pain 02/14/2015  . HTN (hypertension) 02/14/2015  . Diabetes mellitus ([place]) 02/14/2015  . Dizziness and giddiness 01/02/2013  . NEOP, BNG, ADRENAL GLAND 12/06/2006  . HYPERCHOLESTEROLEMIA 12/06/2006  . 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 ANXIETY, CHRONIC 12/06/2006  . ESSENTIAL HYPERTENSION 12/06/2006  . ALLERGIC RHINITIS 12/06/2006  . PEPTIC ULCER DISEASE 12/06/2006  . DEGENERATIVE JOINT DISEASE, KNEE 12/06/2006  . [place] DISEASE, LUMBOSACRAL SPINE 12/06/2006  . BENIGN PROSTATIC HYPERTROPHY, HX OF 12/06/2006    Past Surgical History:  Procedure Laterality Date  . BACK SURGERY    . GASTRIC RESECTION    . LUMBAR DISC SURGERY    . REPLACEMENT TOTAL KNEE         Home Medications    Prior to Admission medications   Medication Sig Start Date End Date Taking? Authorizing Provider  albuterol (PROVENTIL HFA;VENTOLIN HFA) 108 (90 BASE) MCG/ACT inhaler Inhale 2 puffs  into the lungs every 6 (six) hours as needed for wheezing or shortness of breath.    [provider]  aspirin 81 MG chewable tablet Chew 1 tablet (81 mg total) by mouth daily. 02/15/15   Kelvin Cellar, MD  atropine 1 % ophthalmic solution Place 1 drop into the left eye 3 (three) times daily. Patient not taking: Reported on 08/30/2014 08/21/14   Junius Creamer, NP  Cetirizine HCl 10 MG CAPS Take 10 mg by mouth daily.     [provider]  cyclobenzaprine (FLEXERIL) 10 MG tablet Take 10 mg by mouth 3 (three) times daily as needed for muscle spasms.     [provider]  diazepam (VALIUM) 5 MG tablet Take 1 tablet (5 mg total) by mouth 2 (two) times daily. Patient not taking: Reported on 08/21/2014 09/13/13   Hess, Hessie Diener, PA-C  diclofenac (VOLTAREN) 75 MG EC tablet Take 1 tablet by mouth 2 (two) times daily. 12/24/14   [provider]  diphenhydrAMINE (BENADRYL) 25 MG tablet Take 1 tablet (25 mg total) by mouth every 6 (six) hours. Patient not taking: Reported on 02/14/2015 10/29/14   Tanna Furry, MD  doxazosin (CARDURA) 8 MG tablet Take 8 mg by mouth every morning.     [provider]  Fluticasone-Salmeterol (ADVAIR) 250-50 MCG/DOSE AEPB Inhale 1 puff into the lungs 2 (two) times daily as needed (wheezing).     [provider]  hydrALAZINE (APRESOLINE)  25 MG tablet Take 25 mg by mouth 3 (three) times daily.    [provider]  HYDROcodone-acetaminophen (NORCO/VICODIN) 5-325 MG per tablet Take 1-2 tablets by mouth every 4 (four) hours as needed. Patient not taking: Reported on 08/21/2014 09/13/13   Carman Ching, PA-C  lidocaine (LIDODERM) 5 % Place 1 patch onto the skin daily. Apply to lower back or left hip (wherever most painful) Change every 24 hours. 12/04/16 12/09/16  Kinnie Feil, PA-C  metFORMIN (GLUCOPHAGE) 500 MG tablet Take 500 mg by mouth 2 (two) times daily with a meal.    [provider]  omeprazole (PRILOSEC) 20 MG  capsule Take 1 capsule (20 mg total) by mouth daily. Patient not taking: Reported on 10/29/2014 08/30/14   Comer Locket, PA-C  oxyCODONE (ROXICODONE) 5 MG immediate release tablet Take 1 tablet (5 mg total) by mouth every 6 (six) hours as needed for severe pain or breakthrough pain. 12/04/16 12/07/16  Kinnie Feil, PA-C  oxymetazoline (AFRIN) 0.05 % nasal spray Place 2 sprays into the nose 2 (two) times daily as needed for congestion.    [provider]  pantoprazole (PROTONIX) 40 MG tablet Take 1 tablet by mouth daily. 02/13/15   [provider]  pramipexole (MIRAPEX) 0.25 MG tablet Take 0.25 mg by mouth at bedtime. 12/02/14   [provider]  pravastatin (PRAVACHOL) 20 MG tablet Take 20 mg by mouth daily.      [provider]  predniSONE (DELTASONE) 10 MG tablet Take 4 tablets (40 mg total) by mouth daily. 12/04/16 12/09/16  Kinnie Feil, PA-C    Family History Family History  Problem Relation Age of Onset  . Heart attack Mother        hx 2 heart attacks    Social History Social History  Substance Use Topics  . Smoking status: Former Smoker    Quit date: 07/30/1997  . Smokeless tobacco: Never Used  . Alcohol use No     Allergies   Patient has no known allergies.   Review of Systems Review of Systems  Constitutional: Negative for chills and fever.  Respiratory: Negative for cough and shortness of breath.   Cardiovascular: Negative for chest pain.  Gastrointestinal: Negative for abdominal pain, diarrhea and vomiting.  Genitourinary: Negative for difficulty urinating, dysuria and hematuria.  Musculoskeletal: Positive for back pain, gait problem and myalgias.  Skin: Negative for rash.  Neurological: Negative for weakness and numbness.     Physical Exam Updated Vital Signs BP (!) 159/94 (BP Location: Left Arm)   Pulse 93   Temp (!) 97.5 F (36.4 C) (Oral)   Resp 18   SpO2 98%   Physical Exam  Constitutional: He appears  well-developed and well-nourished. No distress.  HENT:  Head: Normocephalic and atraumatic.  Nose: Nose normal.  Eyes: EOM are normal.  Neck: Normal range of motion.  Cardiovascular: Normal rate, S1 normal, S2 normal and normal heart sounds.   Pulses:      Radial pulses are 2+ on the right side, and 2+ on the left side.       Dorsalis pedis pulses are 2+ on the right side, and 2+ on the left side.  Pulmonary/Chest: Effort normal and breath sounds normal. He has no decreased breath sounds. He exhibits no tenderness.  Abdominal: Soft. Normal appearance and bowel sounds are normal. There is no tenderness.  No suprapubic or CVA tenderness   Musculoskeletal: He exhibits tenderness.       Lumbar  back: He exhibits tenderness and pain.  No L spine midline tenderness Left sided lumbar spine paraspinal muscular tenderness with increased tone Left SI joint and sciatic notch tenderness Positive left SLR. Positive left Corky Sox.  Pain with left hip IR. Negative Stinchfield test.   Neurological:  5/5 strength with flexion/extension of hip, knee and ankle, bilaterally.  Sensation to light touch intact in lower extremities including feet  Skin: Skin is warm and dry. Capillary refill takes less than 2 seconds.  Psychiatric: He has a normal mood and affect. His behavior is normal. Judgment and thought content normal.     ED Treatments / Results  Labs (all labs ordered are listed, but only abnormal results are displayed) Labs Reviewed - No data to display  EKG  EKG Interpretation None       Radiology No results found.  Procedures Procedures (including critical care time)  Medications Ordered in ED Medications  HYDROmorphone (DILAUDID) injection 1 mg (1 mg Intramuscular Given 12/04/16 1749)  acetaminophen (TYLENOL) tablet 1,000 mg (1,000 mg Oral Given 12/04/16 1749)  predniSONE (DELTASONE) tablet 60 mg (60 mg Oral Given 12/04/16 1749)     Initial Impression / Assessment and Plan / ED  Course  I have reviewed the triage vital signs and the nursing notes.  Pertinent labs & imaging results that were available during my care of the patient were reviewed by me and considered in my medical decision making (see chart for details).    Patient is a 61 y.o. male with left sided low back pain with radiation to left leg. Pain described as sharp shooting, pins and needles. No trauma or falls.  On exam VS are within normal limits.  Patient can walk but states it aggravates back pain.  There is tenderness at left SI joint, sciatic notch with +SLR.  No groin numbness.  Normal lower extremity neurological exam.  Initial differential diagnoses include muscular strain or spasms, disc bulging with nerve compression and less likely vertebral compression fx, pyelonephritis, nephrolithiasis, epidural abscess, AAA, dissection, cauda equina.  No red flag symptoms of back pain including: fecal incontinence, urinary retention or overflow incontinence, night sweats, waking from sleep with back pain, unexplained fevers or weight loss, h/o cancer, IVDU, recent trauma. No concern for cauda equina, epidural abscess, or other serious cause of back pain at this time.   ED labs and imaging not indicated today as abdominal and MSK exam reassuring and no red flag symptoms present.  I do not suspect bony or intraabdominal/pelvic emergency at this time. Suspect pt experiencing muscular spasms vs lumbar radiculopathy.  Conservative measures such as ice/heat, mild stretches, muscle relaxant, prednisone, lidocaine patch and short course of oxy. PCP follow-up if no improvement with conservative management. [place] narcotic database reviewed, pt does not have other narcotic rx by other providers. Narcotic pain medication warning given. ED return precautions discussed with pt who verbalized understanding and was agreeable to dispo plan.   Final Clinical Impressions(s) / ED Diagnoses   Final diagnoses:  Left leg pain  Sciatic pain,  left    New Prescriptions Discharge Medication List as of 12/04/2016  6:23 PM    START taking these medications   Details  lidocaine (LIDODERM) 5 % Place 1 patch onto the skin daily. Apply to lower back or left hip (wherever most painful) Change every 24 hours., Starting Sat 12/04/2016, Until Thu 12/09/2016, Print    oxyCODONE (ROXICODONE) 5 MG immediate release tablet Take 1 tablet (5 mg total) by mouth every 6 (  six) hours as needed for severe pain or breakthrough pain., Starting Sat 12/04/2016, Until Tue 12/07/2016, Print    predniSONE (DELTASONE) 10 MG tablet Take 4 tablets (40 mg total) by mouth daily., Starting Sat 12/04/2016, Until Thu 12/09/2016, Print         Kinnie Feil, PA-C 12/04/16 2102    Duffy Bruce, MD 12/05/16 1224

## 2016-12-04 NOTE — Discharge Instructions (Signed)
You presented to the emergency department for left buttock pain, left hip pain and left lower leg pain. Based on your physical exam and symptoms I suspect you have pain from an inflamed sciatic nerve. You denied falls or trauma, so no imaging was done today. We will treat your symptoms with medications.   For the next five days, take prednisone for inflammation of nerve, 1000 mg of Tylenol every 8 hours, lidocaine patch daily. You have a short prescription for oxycodone which you will take only for severe breakthrough pain.  Your symptoms should improve within the next 3-5 days. Contact your primary care provider if your symptoms persist. Return to the ED for worsening back pain, abdominal pain, urinary symptoms, groin numbness, and weakness and numbness to extremities, fever.  Oxycodone is a narcotic pain medication that has risk of overdose, death, dependence and abuse. Do not consume alcohol, drive or use heavy machinery while taking this medication. Do not leave unattended around children. The emergency department has a strict policy regarding prescription of narcotic medications. We are unable to refill this medication in the emergency department for chronic pain. Contact your primary care provider or specialist for chronic pain management and refill on narcotic medications.

## 2016-12-04 NOTE — ED Triage Notes (Signed)
Pt with left leg pain since 3-4 days.  No trauma

## 2017-05-18 ENCOUNTER — Encounter: Payer: Self-pay | Admitting: Internal Medicine

## 2018-12-26 ENCOUNTER — Emergency Department (HOSPITAL_COMMUNITY): Payer: Medicaid Other

## 2018-12-26 ENCOUNTER — Encounter (HOSPITAL_COMMUNITY): Payer: Self-pay

## 2018-12-26 ENCOUNTER — Emergency Department (HOSPITAL_COMMUNITY)
Admission: EM | Admit: 2018-12-26 | Discharge: 2018-12-27 | Disposition: A | Discharge status: Home / Self Care | Payer: Medicaid Other | Attending: Emergency Medicine | Admitting: Emergency Medicine

## 2018-12-26 DIAGNOSIS — Z87891 Personal history of nicotine dependence: Secondary | ICD-10-CM | POA: Diagnosis not present

## 2018-12-26 DIAGNOSIS — I10 Essential (primary) hypertension: Secondary | ICD-10-CM | POA: Diagnosis not present

## 2018-12-26 DIAGNOSIS — E119 Type 2 diabetes mellitus without complications: Secondary | ICD-10-CM | POA: Insufficient documentation

## 2018-12-26 DIAGNOSIS — Z7982 Long term (current) use of aspirin: Secondary | ICD-10-CM | POA: Diagnosis not present

## 2018-12-26 DIAGNOSIS — J45909 Unspecified asthma, uncomplicated: Secondary | ICD-10-CM | POA: Diagnosis not present

## 2018-12-26 DIAGNOSIS — Z96659 Presence of unspecified artificial knee joint: Secondary | ICD-10-CM | POA: Insufficient documentation

## 2018-12-26 DIAGNOSIS — R509 Fever, unspecified: Secondary | ICD-10-CM | POA: Diagnosis not present

## 2018-12-26 DIAGNOSIS — Z79899 Other long term (current) drug therapy: Secondary | ICD-10-CM | POA: Insufficient documentation

## 2018-12-26 DIAGNOSIS — J069 Acute upper respiratory infection, unspecified: Secondary | ICD-10-CM

## 2018-12-26 DIAGNOSIS — R51 Headache: Secondary | ICD-10-CM | POA: Diagnosis present

## 2018-12-26 DIAGNOSIS — Z7984 Long term (current) use of oral hypoglycemic drugs: Secondary | ICD-10-CM | POA: Diagnosis not present

## 2018-12-26 DIAGNOSIS — R519 Headache, unspecified: Secondary | ICD-10-CM

## 2018-12-26 LAB — CBC WITH DIFFERENTIAL/PLATELET
Abs Immature Granulocytes: 0.01 10*3/uL (ref 0.00–0.07)
Basophils Absolute: 0 10*3/uL (ref 0.0–0.1)
Basophils Relative: 0 %
Eosinophils Absolute: 0 10*3/uL (ref 0.0–0.5)
Eosinophils Relative: 0 %
HCT: 34.9 % — ABNORMAL LOW (ref 39.0–52.0)
Hemoglobin: 11.3 g/dL — ABNORMAL LOW (ref 13.0–17.0)
Immature Granulocytes: 0 %
Lymphocytes Relative: 16 %
Lymphs Abs: 0.7 10*3/uL (ref 0.7–4.0)
MCH: 28.5 pg (ref 26.0–34.0)
MCHC: 32.4 g/dL (ref 30.0–36.0)
MCV: 88.1 fL (ref 80.0–100.0)
Monocytes Absolute: 0.4 10*3/uL (ref 0.1–1.0)
Monocytes Relative: 9 %
Neutro Abs: 3.3 10*3/uL (ref 1.7–7.7)
Neutrophils Relative %: 75 %
Platelets: 172 10*3/uL (ref 150–400)
RBC: 3.96 MIL/uL — ABNORMAL LOW (ref 4.22–5.81)
RDW: 13.5 % (ref 11.5–15.5)
WBC: 4.4 10*3/uL (ref 4.0–10.5)
nRBC: 0 % (ref 0.0–0.2)

## 2018-12-26 LAB — URINALYSIS, ROUTINE W REFLEX MICROSCOPIC
Bilirubin Urine: NEGATIVE
Glucose, UA: NEGATIVE mg/dL
Hgb urine dipstick: NEGATIVE
Ketones, ur: NEGATIVE mg/dL
Leukocytes,Ua: NEGATIVE
Nitrite: NEGATIVE
Protein, ur: NEGATIVE mg/dL
Specific Gravity, Urine: 1.011 (ref 1.005–1.030)
pH: 6 (ref 5.0–8.0)

## 2018-12-26 LAB — BASIC METABOLIC PANEL
Anion gap: 8 (ref 5–15)
BUN: 16 mg/dL (ref 8–23)
CO2: 28 mmol/L (ref 22–32)
Calcium: 9.1 mg/dL (ref 8.9–10.3)
Chloride: 103 mmol/L (ref 98–111)
Creatinine, Ser: 1.05 mg/dL (ref 0.61–1.24)
GFR calc Af Amer: >60 mL/min (ref >60)
GFR calc non Af Amer: >60 mL/min (ref >60)
Glucose, Bld: 128 mg/dL — ABNORMAL HIGH (ref 70–99)
Potassium: 3.9 mmol/L (ref 3.5–5.1)
Sodium: 139 mmol/L (ref 135–145)

## 2018-12-26 LAB — LACTIC ACID, PLASMA: Lactic Acid, Venous: 1 mmol/L (ref 0.5–1.9)

## 2018-12-26 MED ORDER — METOCLOPRAMIDE HCL 5 MG/ML IJ SOLN
10.0000 mg | Freq: Once | INTRAMUSCULAR | Status: AC
  Administered 2018-12-26: 10 mg via INTRAVENOUS
  Filled 2018-12-26: qty 2

## 2018-12-26 MED ORDER — KETOROLAC TROMETHAMINE 15 MG/ML IJ SOLN
15.0000 mg | Freq: Once | INTRAMUSCULAR | Status: AC
  Administered 2018-12-26: 15 mg via INTRAVENOUS
  Filled 2018-12-26: qty 1

## 2018-12-26 NOTE — Discharge Instructions (Signed)
We saw you in the ER for fevers, headache. All the results in the ER are normal, labs and imaging. We are not sure what is causing your symptoms. The workup in the ER is not complete, and is limited to screening for life threatening and emergent conditions only, so please see a primary care doctor for further evaluation.  Please return to the ER if your symptoms worsen; you have increased pain, fevers, chills, inability to keep any medications down, confusion. Otherwise see the outpatient doctor as requested.

## 2018-12-26 NOTE — ED Provider Notes (Signed)
[facility] DEPT Provider Note   CSN: [id number] Arrival date & time: 12/26/18  1808     History   Chief Complaint Chief Complaint  Patient presents with  . Headache  . Fever    HPI [name] is a 63 y.o. male.     HPI 63 year old comes in a chief complaint of headache and fever. He has history of hypertension and hyperlipidemia.  He reports that he woke up today with moderately severe headache.  As the day went on his headache has been intermittent.  This evening he started noticing that he has a low-grade fever, and his wife who is a CNA decided to bring him into the ER for further evaluation.  Patient denies any URI-like symptoms, neck pain, neck stiffness, chest pain, cough, shortness of breath, abdominal pain, nausea, vomiting, fevers, chills, uti like symptoms, joint pain/swelling.  No covid-19 exposures. Pt has no hx of PE, DVT and denies any exogenous hormone (testosterone / estrogen) use, long distance travels or surgery in the past 6 weeks, active cancer, recent immobilization.   Past Medical History:  Diagnosis Date  . Acid reflux   . Asthma   . Hyperchloremia   . Hyperlipidemia   . Hypertension   . Restless leg syndrome   . Seasonal allergies   . Spinal stenosis     Patient Active Problem List   Diagnosis Date Noted  . Chest pain 02/14/2015  . HTN (hypertension) 02/14/2015  . Diabetes mellitus ([place]) 02/14/2015  . Dizziness and giddiness 01/02/2013  . NEOP, BNG, ADRENAL GLAND 12/06/2006  . HYPERCHOLESTEROLEMIA 12/06/2006  . 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 ANXIETY, CHRONIC 12/06/2006  . ESSENTIAL HYPERTENSION 12/06/2006  . ALLERGIC RHINITIS 12/06/2006  . PEPTIC ULCER DISEASE 12/06/2006  . DEGENERATIVE JOINT DISEASE, KNEE 12/06/2006  . [place] DISEASE, LUMBOSACRAL SPINE 12/06/2006  . BENIGN PROSTATIC HYPERTROPHY, HX OF 12/06/2006    Past Surgical History:  Procedure Laterality Date  . BACK  SURGERY    . GASTRIC RESECTION    . LUMBAR DISC SURGERY    . REPLACEMENT TOTAL KNEE          Home Medications    Prior to Admission medications   Medication Sig Start Date End Date Taking? Authorizing Provider  albuterol (PROVENTIL HFA;VENTOLIN HFA) 108 (90 BASE) MCG/ACT inhaler Inhale 2 puffs into the lungs every 6 (six) hours as needed for wheezing or shortness of breath.   Yes [provider]  aspirin 81 MG chewable tablet Chew 1 tablet (81 mg total) by mouth daily. 02/15/15  Yes Kelvin Cellar, MD  Cetirizine HCl 10 MG CAPS Take 10 mg by mouth daily.    Yes [provider]  cyclobenzaprine (FLEXERIL) 10 MG tablet Take 10 mg by mouth 3 (three) times daily.    Yes [provider]  doxazosin (CARDURA) 8 MG tablet Take 8 mg by mouth every morning.    Yes [provider]  Fluticasone-Salmeterol (ADVAIR) 250-50 MCG/DOSE AEPB Inhale 1 puff into the lungs 2 (two) times daily as needed (wheezing).    Yes [provider]  hydrALAZINE (APRESOLINE) 25 MG tablet Take 25 mg by mouth 3 (three) times daily.   Yes [provider]  metFORMIN (GLUCOPHAGE) 500 MG tablet Take 500 mg by mouth 2 (two) times daily with a meal.   Yes [provider]  omeprazole (PRILOSEC) 40 MG capsule Take 40 mg by mouth daily.   Yes [provider]  oxymetazoline Melissa Montane)  0.05 % nasal spray Place 2 sprays into the nose 2 (two) times daily as needed for congestion.   Yes [provider]  pravastatin (PRAVACHOL) 20 MG tablet Take 20 mg by mouth daily.     Yes [provider]  atropine 1 % ophthalmic solution Place 1 drop into the left eye 3 (three) times daily. Patient not taking: Reported on 08/30/2014 08/21/14   Junius Creamer, NP  diazepam (VALIUM) 5 MG tablet Take 1 tablet (5 mg total) by mouth 2 (two) times daily. Patient not taking: Reported on 08/21/2014 09/13/13   Hess, Hessie Diener, PA-C  diclofenac (VOLTAREN) 75 MG EC tablet Take 1  tablet by mouth 2 (two) times daily. 12/24/14   [provider]  diphenhydrAMINE (BENADRYL) 25 MG tablet Take 1 tablet (25 mg total) by mouth every 6 (six) hours. Patient not taking: Reported on 02/14/2015 10/29/14   Tanna Furry, MD  HYDROcodone-acetaminophen (NORCO/VICODIN) 5-325 MG per tablet Take 1-2 tablets by mouth every 4 (four) hours as needed. Patient not taking: Reported on 08/21/2014 09/13/13   Hess, Hessie Diener, PA-C  omeprazole (PRILOSEC) 20 MG capsule Take 1 capsule (20 mg total) by mouth daily. Patient not taking: Reported on 10/29/2014 08/30/14   Comer Locket, PA-C    Family History Family History  Problem Relation Age of Onset  . Heart attack Mother        hx 2 heart attacks    Social History Social History   Tobacco Use  . Smoking status: Former Smoker    Quit date: 07/30/1997    Years since quitting: 21.4  . Smokeless tobacco: Never Used  Substance Use Topics  . Alcohol use: No  . Drug use: No    Types: Marijuana     Allergies   Patient has no known allergies.   Review of Systems Review of Systems  Constitutional: Positive for activity change.  Respiratory: Negative for cough and shortness of breath.   Cardiovascular: Negative for chest pain.  Gastrointestinal: Negative for abdominal pain, nausea and vomiting.  Genitourinary: Negative for dysuria.  Allergic/Immunologic: Negative for immunocompromised state.  All other systems reviewed and are negative.    Physical Exam Updated Vital Signs BP (!) 160/89 (BP Location: Left Arm)   Pulse (!) 101   Temp 99.7 F (37.6 C) (Oral)   Resp (!) 27   Ht 5\' 7"  (1.702 m)   Wt 128.4 kg   SpO2 95%   BMI 44.32 kg/m   Physical Exam Vitals signs and nursing note reviewed.  Constitutional:      Appearance: He is well-developed.  HENT:     Head: Normocephalic and atraumatic.  Eyes:     Conjunctiva/sclera: Conjunctivae normal.     Pupils: Pupils are equal, round, and reactive to light.  Neck:      Musculoskeletal: Normal range of motion and neck supple.     Comments: No meningismus Cardiovascular:     Rate and Rhythm: Normal rate and regular rhythm.  Pulmonary:     Effort: Pulmonary effort is normal.     Breath sounds: Normal breath sounds.  Abdominal:     General: Bowel sounds are normal. There is no distension.     Palpations: Abdomen is soft. There is no mass.     Tenderness: There is no abdominal tenderness. There is no guarding or rebound.  Musculoskeletal:        General: No deformity.     Comments: No lower extremity edema or focal tenderness  Lymphadenopathy:  Cervical: No cervical adenopathy.  Skin:    General: Skin is warm.  Neurological:     Mental Status: He is alert and oriented to person, place, and time.     GCS: GCS eye subscore is 4. GCS verbal subscore is 5. GCS motor subscore is 6.      ED Treatments / Results  Labs (all labs ordered are listed, but only abnormal results are displayed) Labs Reviewed  CBC WITH DIFFERENTIAL/PLATELET - Abnormal; Notable for the following components:      Result Value   RBC 3.96 (*)    Hemoglobin 11.3 (*)    HCT 34.9 (*)    All other components within normal limits  BASIC METABOLIC PANEL - Abnormal; Notable for the following components:   Glucose, Bld 128 (*)    All other components within normal limits  URINALYSIS, ROUTINE W REFLEX MICROSCOPIC  LACTIC ACID, PLASMA  LACTIC ACID, PLASMA    EKG EKG Interpretation  Date/Time:  Tuesday December 26 2018 22:25:45 EDT Ventricular Rate:  106 PR Interval:    QRS Duration: 90 QT Interval:  343 QTC Calculation: 456 R Axis:   96 Text Interpretation:  Sinus tachycardia Probable left atrial enlargement Right axis deviation Probable anteroseptal infarct, old No acute changes Confirmed by Varney Biles [phone]) on 12/26/2018 11:14:08 PM   Radiology Dg Chest 2 View  Result Date: 12/26/2018 CLINICAL DATA:  Cough, fever, history of asthma and migraines EXAM: CHEST - 2  VIEW COMPARISON:  Radiograph 02/14/2015, CT 01/02/2013 FINDINGS: Redemonstration of chronic airways thickening. Calcified granuloma again seen in the left lung base. No consolidation, features of edema, pneumothorax, or effusion. Pulmonary vascularity is normally distributed. The cardiomediastinal contours are unremarkable. No acute osseous or soft tissue abnormality. Degenerative changes are present in the imaged spine and shoulders. IMPRESSION: Chronic airways thickening compatible with patient history of asthma. No acute consolidative process. Electronically Signed   By: Lovena Le M.D.   On: 12/26/2018 22:39    Procedures Procedures (including critical care time)  Medications Ordered in ED Medications  ketorolac (TORADOL) 15 MG/ML injection 15 mg (15 mg Intravenous Given 12/26/18 2247)  metoCLOPramide (REGLAN) injection 10 mg (10 mg Intravenous Given 12/26/18 2247)     Initial Impression / Assessment and Plan / ED Course  I have reviewed the triage vital signs and the nursing notes.  Pertinent labs & imaging results that were available during my care of the patient were reviewed by me and considered in my medical decision making (see chart for details).        63 year old comes in a chief complaint of fever and a headache. Headaches are generalized and started this morning.  They have been intermittent and at the moment the headache is only 2 out of 10.  He started developing a low-grade fever at 7 PM.  On history, there is no clear evidence of infection.  The exam is also nonfocal.  Specifically there is no meningismus, altered sensorium and patient is immunocompetent.  Basic labs have been ordered for infection screen.  White count is normal.   Patient denies any outdoor activities and no tick bites.  He also denies any COVID-19 exposures.  If the labs are reassuring then will be discharged with strict ER return precautions.  Final Clinical Impressions(s) / ED Diagnoses   Final  diagnoses:  None    ED Discharge Orders    None       Varney Biles, MD 12/26/18 2348

## 2018-12-26 NOTE — ED Triage Notes (Addendum)
Patient c/o headache that started this morning and fever at home of 101.  2/10 headache   Denies shob or chest pain.   Patient states he took tylenol and brought headache down from 7 to 2.     A/ox4 Ambulatory in triage.   Denies being around anyone that has covid.    BP-159/98 in triage  Hx. Migraines per patient.

## 2018-12-26 NOTE — ED Notes (Signed)
Pt transported to xray 

## 2020-08-04 ENCOUNTER — Other Ambulatory Visit: Payer: Self-pay | Admitting: Internal Medicine

## 2020-08-05 LAB — URIC ACID: Uric Acid, Serum: 7.6 mg/dL (ref 4.0–8.0)

## 2020-08-05 LAB — BASIC METABOLIC PANEL WITH GFR
BUN: 15 mg/dL (ref 7–25)
CO2: 30 mmol/L (ref 20–32)
Calcium: 10 mg/dL (ref 8.6–10.3)
Chloride: 102 mmol/L (ref 98–110)
Creat: 1.11 mg/dL (ref 0.70–1.25)
GFR, Est African American: 81 mL/min/{1.73_m2} (ref >=60)
GFR, Est Non African American: 70 mL/min/{1.73_m2} (ref >=60)
Glucose, Bld: 89 mg/dL (ref 65–99)
Potassium: 4 mmol/L (ref 3.5–5.3)
Sodium: 141 mmol/L (ref 135–146)

## 2020-08-05 LAB — EXTRA LAV TOP TUBE

## 2021-05-03 HISTORY — PX: OTHER SURGICAL HISTORY: SHX169

## 2021-07-17 ENCOUNTER — Ambulatory Visit: Payer: Self-pay | Admitting: Surgery

## 2021-08-21 ENCOUNTER — Other Ambulatory Visit: Payer: Self-pay | Admitting: Otolaryngology

## 2021-09-15 ENCOUNTER — Encounter (HOSPITAL_COMMUNITY): Payer: Self-pay | Admitting: Otolaryngology

## 2021-09-15 NOTE — Progress Notes (Signed)
Pt cannot be reached to give pre-op instructions. Cannot leave voicemail and one of the number called is now invalid.  ?

## 2021-09-15 NOTE — Anesthesia Preprocedure Evaluation (Addendum)
Anesthesia Evaluation  ?Patient identified by MRN, date of birth, ID band ?Patient awake ? ? ? ?Reviewed: ?Allergy & Precautions, NPO status , Patient's Chart, lab work & pertinent test results, reviewed documented beta blocker date and time  ? ?Airway ?Mallampati: III ? ?TM Distance: >3 FB ?Neck ROM: Full ? ? ? Dental ?no notable dental hx. ?(+) Dental Advisory Given ?  ?Pulmonary ?asthma , former smoker,  ?  ?breath sounds clear to auscultation ?+ decreased breath sounds ? ? ? ? ? Cardiovascular ?hypertension, Pt. on medications ?Normal cardiovascular exam ?Rhythm:Regular Rate:Normal ? ?EKG ? ? ? ?Echo ?  ?Neuro/Psych ?PSYCHIATRIC DISORDERS Anxiety Depression negative neurological ROS ?   ? GI/Hepatic ?Neg liver ROS, PUD, GERD  Medicated,  ?Endo/Other  ?diabetes, Well Controlled, Type 2, Oral Hypoglycemic AgentsMorbid obesityhyperlipidemia ? Renal/GU ?negative Renal ROS  ? ?BPH ? ?  ?Musculoskeletal ? ?(+) Arthritis , Osteoarthritis,  Hx/o spinal stenosis ?Benign epidermoid cyst posterior neck  ? Abdominal ?(+) + obese,   ?Peds ? Hematology ? ?(+) Blood dyscrasia, anemia ,   ?Anesthesia Other Findings ? ? Reproductive/Obstetrics ? ?  ? ? ? ? ? ? ? ? ? ? ? ? ? ?  ?  ? ? ? ? ? ? ?Anesthesia Physical ?Anesthesia Plan ? ?ASA: 3 ? ?Anesthesia Plan: General  ? ?Post-op Pain Management: Minimal or no pain anticipated  ? ?Induction: Intravenous ? ?PONV Risk Score and Plan: 3 and Treatment may vary due to age or medical condition, Midazolam and Ondansetron ? ?Airway Management Planned: LMA ? ?Additional Equipment: None ? ?Intra-op Plan:  ? ?Post-operative Plan: Extubation in OR ? ?Informed Consent: I have reviewed the patients History and Physical, chart, labs and discussed the procedure including the risks, benefits and alternatives for the proposed anesthesia with the patient or authorized representative who has indicated his/her understanding and acceptance.  ? ? ? ?Dental advisory  given ? ?Plan Discussed with: CRNA and Anesthesiologist ? ?Anesthesia Plan Comments:   ? ? ? ? ?Anesthesia Quick Evaluation ? ?

## 2021-09-16 ENCOUNTER — Encounter (HOSPITAL_COMMUNITY)
Admission: RE | Disposition: A | Discharge status: Home / Self Care | Payer: Self-pay | Source: Home / Self Care | Attending: Otolaryngology

## 2021-09-16 ENCOUNTER — Other Ambulatory Visit: Payer: Self-pay

## 2021-09-16 ENCOUNTER — Encounter (HOSPITAL_COMMUNITY): Payer: Self-pay | Admitting: Otolaryngology

## 2021-09-16 ENCOUNTER — Ambulatory Visit (HOSPITAL_BASED_OUTPATIENT_CLINIC_OR_DEPARTMENT_OTHER): Payer: Medicare Other | Admitting: Anesthesiology

## 2021-09-16 ENCOUNTER — Ambulatory Visit (HOSPITAL_COMMUNITY)
Admission: RE | Admit: 2021-09-16 | Discharge: 2021-09-16 | Disposition: A | Discharge status: Home / Self Care | Payer: Medicare Other | Attending: Otolaryngology | Admitting: Otolaryngology

## 2021-09-16 ENCOUNTER — Ambulatory Visit (HOSPITAL_COMMUNITY): Payer: Medicare Other | Admitting: Anesthesiology

## 2021-09-16 DIAGNOSIS — K219 Gastro-esophageal reflux disease without esophagitis: Secondary | ICD-10-CM | POA: Insufficient documentation

## 2021-09-16 DIAGNOSIS — E119 Type 2 diabetes mellitus without complications: Secondary | ICD-10-CM | POA: Diagnosis not present

## 2021-09-16 DIAGNOSIS — L720 Epidermal cyst: Secondary | ICD-10-CM

## 2021-09-16 DIAGNOSIS — E785 Hyperlipidemia, unspecified: Secondary | ICD-10-CM | POA: Diagnosis not present

## 2021-09-16 DIAGNOSIS — Z87891 Personal history of nicotine dependence: Secondary | ICD-10-CM | POA: Diagnosis not present

## 2021-09-16 DIAGNOSIS — I10 Essential (primary) hypertension: Secondary | ICD-10-CM

## 2021-09-16 DIAGNOSIS — Z7984 Long term (current) use of oral hypoglycemic drugs: Secondary | ICD-10-CM

## 2021-09-16 DIAGNOSIS — R221 Localized swelling, mass and lump, neck: Secondary | ICD-10-CM | POA: Diagnosis present

## 2021-09-16 HISTORY — PX: EXCISION MASS NECK: SHX6703

## 2021-09-16 HISTORY — DX: Sleep apnea, unspecified: G47.30

## 2021-09-16 HISTORY — DX: Prediabetes: R73.03

## 2021-09-16 LAB — BASIC METABOLIC PANEL
Anion gap: 6 (ref 5–15)
BUN: 12 mg/dL (ref 8–23)
CO2: 28 mmol/L (ref 22–32)
Calcium: 9.4 mg/dL (ref 8.9–10.3)
Chloride: 108 mmol/L (ref 98–111)
Creatinine, Ser: 0.99 mg/dL (ref 0.61–1.24)
GFR, Estimated: >60 mL/min (ref >60)
Glucose, Bld: 110 mg/dL — ABNORMAL HIGH (ref 70–99)
Potassium: 4.2 mmol/L (ref 3.5–5.1)
Sodium: 142 mmol/L (ref 135–145)

## 2021-09-16 LAB — CBC
HCT: 40.2 % (ref 39.0–52.0)
Hemoglobin: 13 g/dL (ref 13.0–17.0)
MCH: 28.3 pg (ref 26.0–34.0)
MCHC: 32.3 g/dL (ref 30.0–36.0)
MCV: 87.4 fL (ref 80.0–100.0)
Platelets: 231 10*3/uL (ref 150–400)
RBC: 4.6 MIL/uL (ref 4.22–5.81)
RDW: 13.3 % (ref 11.5–15.5)
WBC: 5.9 10*3/uL (ref 4.0–10.5)
nRBC: 0 % (ref 0.0–0.2)

## 2021-09-16 LAB — GLUCOSE, CAPILLARY
Glucose-Capillary: 117 mg/dL — ABNORMAL HIGH (ref 70–99)
Glucose-Capillary: 90 mg/dL (ref 70–99)
Glucose-Capillary: 125 mg/dL — ABNORMAL HIGH (ref 70–99)

## 2021-09-16 SURGERY — EXCISION, MASS, NECK
Anesthesia: General | Site: Neck

## 2021-09-16 MED ORDER — DEXAMETHASONE SODIUM PHOSPHATE 10 MG/ML IJ SOLN
INTRAMUSCULAR | Status: DC | PRN
  Administered 2021-09-16: 5 mg via INTRAVENOUS

## 2021-09-16 MED ORDER — LIDOCAINE 2% (20 MG/ML) 5 ML SYRINGE
INTRAMUSCULAR | Status: DC | PRN
  Administered 2021-09-16: 80 mg via INTRAVENOUS

## 2021-09-16 MED ORDER — FENTANYL CITRATE (PF) 100 MCG/2ML IJ SOLN: 25.0000 ug | INTRAMUSCULAR | Status: DC | PRN

## 2021-09-16 MED ORDER — ONDANSETRON HCL 4 MG/2ML IJ SOLN
INTRAMUSCULAR | Status: AC
  Filled 2021-09-16: qty 2

## 2021-09-16 MED ORDER — OXYCODONE HCL 5 MG/5ML PO SOLN: 5.0000 mg | Freq: Once | ORAL | Status: DC | PRN

## 2021-09-16 MED ORDER — ORAL CARE MOUTH RINSE: 15.0000 mL | Freq: Once | OROMUCOSAL | Status: AC

## 2021-09-16 MED ORDER — PROPOFOL 10 MG/ML IV BOLUS
INTRAVENOUS | Status: DC | PRN
  Administered 2021-09-16: 150 mg via INTRAVENOUS
  Administered 2021-09-16: 50 mg via INTRAVENOUS

## 2021-09-16 MED ORDER — MIDAZOLAM HCL 2 MG/2ML IJ SOLN
INTRAMUSCULAR | Status: AC
  Filled 2021-09-16: qty 2

## 2021-09-16 MED ORDER — MIDAZOLAM HCL 2 MG/2ML IJ SOLN
INTRAMUSCULAR | Status: DC | PRN
  Administered 2021-09-16: 2 mg via INTRAVENOUS

## 2021-09-16 MED ORDER — LIDOCAINE-EPINEPHRINE 1 %-1:100000 IJ SOLN
INTRAMUSCULAR | Status: DC | PRN
  Administered 2021-09-16: 10 mL

## 2021-09-16 MED ORDER — BACITRACIN ZINC 500 UNIT/GM EX OINT
TOPICAL_OINTMENT | CUTANEOUS | Status: AC
  Filled 2021-09-16: qty 28.35

## 2021-09-16 MED ORDER — CEFAZOLIN IN SODIUM CHLORIDE 3-0.9 GM/100ML-% IV SOLN
3.0000 g | INTRAVENOUS | Status: AC
  Administered 2021-09-16: 3 g via INTRAVENOUS
  Filled 2021-09-16: qty 100

## 2021-09-16 MED ORDER — PHENYLEPHRINE 80 MCG/ML (10ML) SYRINGE FOR IV PUSH (FOR BLOOD PRESSURE SUPPORT)
PREFILLED_SYRINGE | INTRAVENOUS | Status: AC
  Filled 2021-09-16: qty 10

## 2021-09-16 MED ORDER — LACTATED RINGERS IV SOLN: INTRAVENOUS | Status: DC

## 2021-09-16 MED ORDER — ONDANSETRON HCL 4 MG/2ML IJ SOLN: 4.0000 mg | Freq: Once | INTRAMUSCULAR | Status: DC | PRN

## 2021-09-16 MED ORDER — PROPOFOL 10 MG/ML IV BOLUS
INTRAVENOUS | Status: AC
  Filled 2021-09-16: qty 20

## 2021-09-16 MED ORDER — PHENYLEPHRINE 80 MCG/ML (10ML) SYRINGE FOR IV PUSH (FOR BLOOD PRESSURE SUPPORT)
PREFILLED_SYRINGE | INTRAVENOUS | Status: DC | PRN
  Administered 2021-09-16 (×2): 160 ug via INTRAVENOUS
  Administered 2021-09-16: 80 ug via INTRAVENOUS
  Administered 2021-09-16: 160 ug via INTRAVENOUS

## 2021-09-16 MED ORDER — SUCCINYLCHOLINE CHLORIDE 200 MG/10ML IV SOSY
PREFILLED_SYRINGE | INTRAVENOUS | Status: DC | PRN
  Administered 2021-09-16: 140 mg via INTRAVENOUS

## 2021-09-16 MED ORDER — FENTANYL CITRATE (PF) 250 MCG/5ML IJ SOLN
INTRAMUSCULAR | Status: AC
  Filled 2021-09-16: qty 5

## 2021-09-16 MED ORDER — CHLORHEXIDINE GLUCONATE 0.12 % MT SOLN
15.0000 mL | Freq: Once | OROMUCOSAL | Status: AC
  Administered 2021-09-16: 15 mL via OROMUCOSAL
  Filled 2021-09-16: qty 15

## 2021-09-16 MED ORDER — OXYCODONE HCL 5 MG PO TABS: 5.0000 mg | ORAL_TABLET | Freq: Once | ORAL | Status: DC | PRN

## 2021-09-16 MED ORDER — FENTANYL CITRATE (PF) 100 MCG/2ML IJ SOLN
INTRAMUSCULAR | Status: DC | PRN
  Administered 2021-09-16 (×2): 50 ug via INTRAVENOUS

## 2021-09-16 MED ORDER — 0.9 % SODIUM CHLORIDE (POUR BTL) OPTIME
TOPICAL | Status: DC | PRN
  Administered 2021-09-16: 1000 mL

## 2021-09-16 MED ORDER — ONDANSETRON HCL 4 MG/2ML IJ SOLN
INTRAMUSCULAR | Status: DC | PRN
  Administered 2021-09-16: 4 mg via INTRAVENOUS

## 2021-09-16 MED ORDER — LIDOCAINE-EPINEPHRINE 1 %-1:100000 IJ SOLN
INTRAMUSCULAR | Status: AC
  Filled 2021-09-16: qty 1

## 2021-09-16 SURGICAL SUPPLY — 25 items
ADH SKN CLS APL DERMABOND .7 (GAUZE/BANDAGES/DRESSINGS) ×1
CANISTER SUCT 3000ML PPV (MISCELLANEOUS) ×1 IMPLANT
CNTNR URN SCR LID CUP LEK RST (MISCELLANEOUS) ×2 IMPLANT
CONT SPEC 4OZ STRL OR WHT (MISCELLANEOUS) ×2
COVER SURGICAL LIGHT HANDLE (MISCELLANEOUS) ×3 IMPLANT
DERMABOND ADVANCED (GAUZE/BANDAGES/DRESSINGS) ×1
DERMABOND ADVANCED .7 DNX12 (GAUZE/BANDAGES/DRESSINGS) IMPLANT
DRAPE HALF SHEET 40X57 (DRAPES) ×1 IMPLANT
ELECT COATED BLADE 2.86 ST (ELECTRODE) ×3 IMPLANT
ELECT REM PT RETURN 9FT ADLT (ELECTROSURGICAL) ×2
ELECTRODE REM PT RTRN 9FT ADLT (ELECTROSURGICAL) ×2 IMPLANT
GLOVE BIO SURGEON STRL SZ 6.5 (GLOVE) ×3 IMPLANT
GOWN STRL REUS W/ TWL LRG LVL3 (GOWN DISPOSABLE) ×4 IMPLANT
GOWN STRL REUS W/TWL LRG LVL3 (GOWN DISPOSABLE) ×4
KIT BASIN OR (CUSTOM PROCEDURE TRAY) ×3 IMPLANT
KIT TURNOVER KIT B (KITS) ×1 IMPLANT
NDL HYPO 25GX1X1/2 BEV (NEEDLE) IMPLANT
NEEDLE HYPO 25GX1X1/2 BEV (NEEDLE) ×2 IMPLANT
NS IRRIG 1000ML POUR BTL (IV SOLUTION) ×3 IMPLANT
PAD ARMBOARD 7.5X6 YLW CONV (MISCELLANEOUS) ×6 IMPLANT
PENCIL SMOKE EVACUATOR (MISCELLANEOUS) ×3 IMPLANT
POSITIONER HEAD DONUT 9IN (MISCELLANEOUS) IMPLANT
STRIP CLOSURE SKIN 1/2X4 (GAUZE/BANDAGES/DRESSINGS) ×1 IMPLANT
SUT VIC AB 4-0 PS2 27 (SUTURE) ×2 IMPLANT
TRAY ENT MC OR (CUSTOM PROCEDURE TRAY) ×3 IMPLANT

## 2021-09-16 NOTE — Anesthesia Postprocedure Evaluation (Signed)
Anesthesia Post Note ? ?Patient: [name] ? ?Procedure(s) Performed: EXCISION OF BENIGN SUPERFICIAL POSTERIOR MASS NECK (Neck) ? ?  ? ?Patient location during evaluation: PACU ?Anesthesia Type: General ?Level of consciousness: awake and alert and oriented ?Pain management: pain level controlled ?Vital Signs Assessment: post-procedure vital signs reviewed and stable ?Respiratory status: spontaneous breathing, nonlabored ventilation and respiratory function stable ?Cardiovascular status: blood pressure returned to baseline and stable ?Postop Assessment: no apparent nausea or vomiting ?Anesthetic complications: no ? ? ?No notable events documented. ? ?Last Vitals:  ?Vitals:  ? 09/16/21 1055 09/16/21 1110  ?BP: (!) 164/79 (!) 142/79  ?Pulse: 80 78  ?Resp: 18 16  ?Temp:  36.7 ?C  ?SpO2: 95% 97%  ?  ?Last Pain:  ?Vitals:  ? 09/16/21 1110  ?TempSrc:   ?PainSc: 0-No pain  ? ? ?  ?  ?  ?  ?  ?  ? ?Colin Bogie A. ? ? ? ? ?

## 2021-09-16 NOTE — Transfer of Care (Signed)
Immediate Anesthesia Transfer of Care Note ? ?Patient: [name] ? ?Procedure(s) Performed: EXCISION OF BENIGN SUPERFICIAL POSTERIOR MASS NECK (Neck) ? ?Patient Location: PACU ? ?Anesthesia Type:General ? ?Level of Consciousness: awake and alert  ? ?Airway & Oxygen Therapy: Patient Spontanous Breathing and Patient connected to face mask oxygen ? ?Post-op Assessment: Report given to RN and Post -op Vital signs reviewed and stable ? ?Post vital signs: Reviewed and stable ? ?Last Vitals:  ?Vitals Value Taken Time  ?BP 162/62 09/16/21 1040  ?Temp    ?Pulse 93 09/16/21 1041  ?Resp 21 09/16/21 1041  ?SpO2 97 % 09/16/21 1041  ?Vitals shown include unvalidated device data. ? ?Last Pain:  ?Vitals:  ? 09/16/21 0824  ?TempSrc:   ?PainSc: 0-No pain  ?   ? ?  ? ?Complications: No notable events documented. ?

## 2021-09-16 NOTE — Op Note (Signed)
OPERATIVE NOTE ? ?[name] Date/Time of Admission: 09/16/2021  6:46 AM  ?CSN: [id number] Attending Provider: Jason Coop, DO ?Room/Bed: MCPO/NONE DOB: [birth year] Age: 66 y.o. ? ? ?Pre-Op Diagnosis: ?Epidermoid cyst of neck ? ?Post-Op Diagnosis: ?Epidermoid cyst of neck ? ?Procedure: ?Procedure(s): ?EXCISION OF BENIGN SUPERFICIAL POSTERIOR MASS NECK 2.5CM ? ?Anesthesia: ?General ? ?Surgeon(s): ?[place], DO ? ?Staff: ?Circulator: [place], Karrie S, RN ?Scrub Person: Rowe Robert ? ?Implants: ?* No implants in log * ? ?Specimens: ?ID Type Source Tests Collected by Time Destination  ?1 : Left posterior neck mass Tissue PATH ENT excision SURGICAL PATHOLOGY Carleton Vanval[place] A, DO 09/16/2021 1011   ? ? ?Complications: ?None ? ?EBL: ?<5 ML ? ?Condition: ?stable ? ?Operative Findings:  ?2.5cm benign appearing superficial neck mass with associated large pore ? ?Description of Operation: ?Once the operative consent was obtained and the site and surgery were confirmed with the patient and the operating room team, the patient was brought back to the operating room and general anesthesia was smoothly obtained. Local anesthesia, 1% lidocaine with 1:100,000 epinephrine was injected into the lesion, 10 mL total. The patient was prepped and draped in sterile fashion and turned over to the ENT service. The skin lesion was identified and an ellipse was planned with oriented with Langerhans' lines. The total planned defect was 1.0 x 2.5 cm.  The planned incision was made with a 15 blade and the lesion was excised from the surrounding tissue using a combination of mets, bipolar cautery and blunt dissection. The skin surrounding the defect was undermined to facilitate closure. Hemostasis was obtained with bipolar electrocautery.  The skin was then closed with interrupted 4-0 Vicryl sutures in the deep tissues and the dermis. Dermabond was placed and steristrips were placed on the skin.  The patient was  turned over to the anesthesia service. The patient was transferred to the PACU in stable condition.   ? ? ?[place], DO ?[facility]  ?09/16/2021   ? ?

## 2021-09-16 NOTE — H&P (Signed)
[name] is an 66 y.o. male.   ? ?Chief Complaint:  Posterior neck mass ? ?HPI: Patient presents today for planned elective procedure.  He denies any interval change in history since office visit on 07/31/2021: ? ?[name] is a 66 y.o. male who presents as a new consult, referred by Tanna Savoy,*, for evaluation and treatment of cyst on back of neck. Patient states that this has been present for approximately the last 2 to 3 years. He denies history of infection, but states that the cyst does cause him some pain. He states that he has had similar cyst on his head and on his elbow. He reports that the cyst on his head was excised without complication. He denies history of keloid formation. ? ?Past Medical History:  ?Diagnosis Date  ? Acid reflux   ? Asthma   ? Borderline diabetic   ? Hyperchloremia   ? Hyperlipidemia   ? Hypertension   ? Restless leg syndrome   ? Seasonal allergies   ? Sleep apnea   ? does not wear CPAP  ? Spinal stenosis   ? ? ?Past Surgical History:  ?Procedure Laterality Date  ? BACK SURGERY    ? GASTRIC RESECTION    ? LUMBAR DISC SURGERY    ? REPLACEMENT TOTAL KNEE    ? ? ?Family History  ?Problem Relation Age of Onset  ? Heart attack Mother   ?     hx 2 heart attacks  ? ? ?Social History:  reports that he quit smoking about 24 years ago. His smoking use included cigarettes. He has never used smokeless tobacco. He reports that he does not drink alcohol and does not use drugs. ? ?Allergies: No Known Allergies ? ?Medications Prior to Admission  ?Medication Sig Dispense Refill  ? acetaminophen (TYLENOL) 500 MG tablet Take 1,000 mg by mouth every 8 (eight) hours as needed for moderate pain.    ? albuterol (PROVENTIL HFA;VENTOLIN HFA) 108 (90 BASE) MCG/ACT inhaler Inhale 2 puffs into the lungs every 6 (six) hours as needed for wheezing or shortness of breath.    ? aspirin 81 MG chewable tablet Chew 1 tablet (81 mg total) by mouth daily. 30 tablet 0  ? colchicine 0.6 MG tablet Take 0.6  mg by mouth daily.    ? cyclobenzaprine (FLEXERIL) 10 MG tablet Take 10 mg by mouth 2 (two) times daily.    ? doxazosin (CARDURA) 8 MG tablet Take 8 mg by mouth every morning.     ? esomeprazole (NEXIUM) 40 MG capsule Take 40 mg by mouth daily.    ? Fluticasone-Umeclidin-Vilant (TRELEGY ELLIPTA) 200-62.5-25 MCG/ACT AEPB Inhale 2 puffs into the lungs daily.    ? hydrALAZINE (APRESOLINE) 50 MG tablet Take 50 mg by mouth 2 (two) times daily.    ? metFORMIN (GLUCOPHAGE) 500 MG tablet Take 500 mg by mouth daily.    ? oxymetazoline (AFRIN) 0.05 % nasal spray Place 2 sprays into both nostrils 2 (two) times daily as needed for congestion.    ? pravastatin (PRAVACHOL) 20 MG tablet Take 20 mg by mouth daily.      ? sitaGLIPtin (JANUVIA) 100 MG tablet Take 100 mg by mouth daily.    ? levocetirizine (XYZAL) 5 MG tablet Take 5 mg by mouth at bedtime.    ? ? ?Results for orders placed or performed during the hospital encounter of 09/16/21 (from the past 48 hour(s))  ?Glucose, capillary     Status: Abnormal  ? Collection  Time: 09/16/21  7:06 AM  ?Result Value Ref Range  ? Glucose-Capillary 117 (H) 70 - 99 mg/dL  ?  Comment: Glucose reference range applies only to samples taken after fasting for at least 8 hours.  ?Basic metabolic panel per protocol     Status: Abnormal  ? Collection Time: 09/16/21  8:35 AM  ?Result Value Ref Range  ? Sodium 142 135 - 145 mmol/L  ? Potassium 4.2 3.5 - 5.1 mmol/L  ? Chloride 108 98 - 111 mmol/L  ? CO2 28 22 - 32 mmol/L  ? Glucose, Bld 110 (H) 70 - 99 mg/dL  ?  Comment: Glucose reference range applies only to samples taken after fasting for at least 8 hours.  ? BUN 12 8 - 23 mg/dL  ? Creatinine, Ser 0.99 0.61 - 1.24 mg/dL  ? Calcium 9.4 8.9 - 10.3 mg/dL  ? GFR, Estimated >60 >60 mL/min  ?  Comment: (NOTE) ?Calculated using the CKD-EPI Creatinine Equation (2021) ?  ? Anion gap 6 5 - 15  ?  Comment: Performed at [facility], [place] [address] [place], [place] [address]  ?CBC per protocol      Status: None  ? Collection Time: 09/16/21  8:35 AM  ?Result Value Ref Range  ? WBC 5.9 4.0 - 10.5 K/uL  ? RBC 4.60 4.22 - 5.81 MIL/uL  ? Hemoglobin 13.0 13.0 - 17.0 g/dL  ? HCT 40.2 39.0 - 52.0 %  ? MCV 87.4 80.0 - 100.0 fL  ? MCH 28.3 26.0 - 34.0 pg  ? MCHC 32.3 30.0 - 36.0 g/dL  ? RDW 13.3 11.5 - 15.5 %  ? Platelets 231 150 - 400 K/uL  ? nRBC 0.0 0.0 - 0.2 %  ?  Comment: Performed at [facility], [place] [address], [place], [place] [address]  ? ?No results found. ? ?ROS: Review of Systems  ?All other systems reviewed and are negative. ? ?Blood pressure (!) 165/85, pulse 79, temperature 97.8 ?F (36.6 ?C), temperature source Oral, resp. rate 18, height '5\' 7"'$  (1.702 m), weight 126.6 kg, SpO2 93 %. ? ?PHYSICAL EXAM: ?Physical Exam ?Constitutional:   ?   Appearance: Normal appearance. He is obese.  ?HENT:  ?   Right Ear: External ear normal.  ?   Left Ear: External ear normal.  ?   Nose: Nose normal.  ?Neck:  ?   Comments: 2cm mobile left posterior superficial neck mass  ?Musculoskeletal:  ?   Cervical back: Normal range of motion.  ?Neurological:  ?   General: No focal deficit present.  ?   Mental Status: He is alert and oriented to person, place, and time.  ?Psychiatric:     ?   Mood and Affect: Mood normal.     ?   Behavior: Behavior normal.  ? ? ?Studies Reviewed: None ? ? ?Assessment/Plan ?[name] is a 66 y.o. male with benign cystic lesion of the mid posterior neck, present for the last 2 to 3 years with no recent history of infection but with associated pain.  ?-To OR today for excisional biopsy.  Risks, benefits, expected postoperative course and recovery were reviewed with patient, who expressed understanding and agreement. ? ? ?[name] A Dyshaun Bonzo ?09/16/2021, 9:07 AM ? ? ? ?

## 2021-09-16 NOTE — Anesthesia Procedure Notes (Signed)
Procedure Name: Intubation ?Date/Time: 09/16/2021 9:45 AM ?Performed by: Inda Coke, CRNA ?Pre-anesthesia Checklist: Patient identified, Emergency Drugs available, Suction available and Patient being monitored ?Patient Re-evaluated:Patient Re-evaluated prior to induction ?Oxygen Delivery Method: Circle System Utilized ?Preoxygenation: Pre-oxygenation with 100% oxygen ?Induction Type: IV induction ?Ventilation: Mask ventilation without difficulty ?Laryngoscope Size: Mac and 4 ?Grade View: Grade II ?Tube type: Oral ?Tube size: 7.5 mm ?Number of attempts: 1 ?Airway Equipment and Method: Stylet and Oral airway ?Placement Confirmation: ETT inserted through vocal cords under direct vision, positive ETCO2 and breath sounds checked- equal and bilateral ?Secured at: 22 cm ?Tube secured with: Tape ?Dental Injury: Teeth and Oropharynx as per pre-operative assessment  ? ? ? ? ?

## 2021-09-17 ENCOUNTER — Encounter (HOSPITAL_COMMUNITY): Payer: Self-pay | Admitting: Otolaryngology

## 2021-09-17 LAB — SURGICAL PATHOLOGY

## 2022-01-01 ENCOUNTER — Other Ambulatory Visit: Payer: Self-pay | Admitting: Physical Medicine and Rehabilitation

## 2022-01-01 DIAGNOSIS — M5416 Radiculopathy, lumbar region: Secondary | ICD-10-CM

## 2022-01-08 ENCOUNTER — Ambulatory Visit
Admission: RE | Admit: 2022-01-08 | Discharge: 2022-01-08 | Disposition: A | Discharge status: Home / Self Care | Payer: Medicare Other | Source: Ambulatory Visit | Attending: Physical Medicine and Rehabilitation | Admitting: Physical Medicine and Rehabilitation

## 2022-01-08 DIAGNOSIS — M5416 Radiculopathy, lumbar region: Secondary | ICD-10-CM

## 2022-01-08 MED ORDER — DIAZEPAM 5 MG PO TABS
5.0000 mg | ORAL_TABLET | Freq: Once | ORAL | Status: AC
  Administered 2022-01-08: 5 mg via ORAL

## 2022-01-08 MED ORDER — ONDANSETRON HCL 4 MG/2ML IJ SOLN: 4.0000 mg | Freq: Once | INTRAMUSCULAR | Status: DC | PRN

## 2022-01-08 MED ORDER — MEPERIDINE HCL 50 MG/ML IJ SOLN: 50.0000 mg | Freq: Once | INTRAMUSCULAR | Status: DC | PRN

## 2022-01-08 MED ORDER — IOPAMIDOL (ISOVUE-M 200) INJECTION 41%
20.0000 mL | Freq: Once | INTRAMUSCULAR | Status: AC
  Administered 2022-01-08: 20 mL via INTRATHECAL

## 2022-01-08 NOTE — Discharge Instructions (Signed)

## 2022-01-15 ENCOUNTER — Other Ambulatory Visit: Payer: Self-pay | Admitting: Internal Medicine

## 2022-01-16 LAB — CBC
HCT: 41.1 % (ref 38.5–50.0)
Hemoglobin: 13.9 g/dL (ref 13.2–17.1)
MCH: 28.3 pg (ref 27.0–33.0)
MCHC: 33.8 g/dL (ref 32.0–36.0)
MCV: 83.5 fL (ref 80.0–100.0)
MPV: 10.3 fL (ref 7.5–12.5)
Platelets: 233 10*3/uL (ref 140–400)
RBC: 4.92 10*6/uL (ref 4.20–5.80)
RDW: 13.4 % (ref 11.0–15.0)
WBC: 5 10*3/uL (ref 3.8–10.8)

## 2022-01-16 LAB — COMPLETE METABOLIC PANEL WITH GFR
AG Ratio: 1.6 (calc) (ref 1.0–2.5)
ALT: 23 U/L (ref 9–46)
AST: 25 U/L (ref 10–35)
Albumin: 4.4 g/dL (ref 3.6–5.1)
Alkaline phosphatase (APISO): 46 U/L (ref 35–144)
BUN: 16 mg/dL (ref 7–25)
CO2: 30 mmol/L (ref 20–32)
Calcium: 10.2 mg/dL (ref 8.6–10.3)
Chloride: 102 mmol/L (ref 98–110)
Creat: 1.19 mg/dL (ref 0.70–1.35)
Globulin: 2.7 g/dL (calc) (ref 1.9–3.7)
Glucose, Bld: 83 mg/dL (ref 65–99)
Potassium: 3.8 mmol/L (ref 3.5–5.3)
Sodium: 141 mmol/L (ref 135–146)
Total Bilirubin: 0.4 mg/dL (ref 0.2–1.2)
Total Protein: 7.1 g/dL (ref 6.1–8.1)
eGFR: 68 mL/min/{1.73_m2} (ref >=60)

## 2022-01-16 LAB — TSH: TSH: 1.16 mIU/L (ref 0.40–4.50)

## 2022-01-16 LAB — VITAMIN D 25 HYDROXY (VIT D DEFICIENCY, FRACTURES): Vit D, 25-Hydroxy: 61 ng/mL (ref 30–100)

## 2022-02-23 ENCOUNTER — Other Ambulatory Visit (HOSPITAL_COMMUNITY): Payer: Self-pay | Admitting: Specialist

## 2022-02-23 DIAGNOSIS — I700 Atherosclerosis of aorta: Secondary | ICD-10-CM

## 2022-02-23 DIAGNOSIS — M5451 Vertebrogenic low back pain: Secondary | ICD-10-CM

## 2022-03-02 ENCOUNTER — Ambulatory Visit (HOSPITAL_COMMUNITY): Payer: Medicare Other

## 2022-03-10 ENCOUNTER — Ambulatory Visit (HOSPITAL_COMMUNITY): Payer: Medicare Other

## 2022-03-16 ENCOUNTER — Ambulatory Visit (HOSPITAL_COMMUNITY)
Admission: RE | Admit: 2022-03-16 | Discharge: 2022-03-16 | Disposition: A | Discharge status: Home / Self Care | Payer: Medicare Other | Source: Ambulatory Visit | Attending: Specialist | Admitting: Specialist

## 2022-03-16 DIAGNOSIS — M5451 Vertebrogenic low back pain: Secondary | ICD-10-CM | POA: Insufficient documentation

## 2022-03-16 DIAGNOSIS — I700 Atherosclerosis of aorta: Secondary | ICD-10-CM | POA: Insufficient documentation

## 2022-07-12 ENCOUNTER — Encounter: Payer: Self-pay | Admitting: Gastroenterology

## 2022-08-19 ENCOUNTER — Ambulatory Visit (AMBULATORY_SURGERY_CENTER): Payer: 59

## 2022-08-19 VITALS — Ht 67.0 in | Wt 279.0 lb

## 2022-08-19 DIAGNOSIS — Z1211 Encounter for screening for malignant neoplasm of colon: Secondary | ICD-10-CM

## 2022-08-19 MED ORDER — NA SULFATE-K SULFATE-MG SULF 17.5-3.13-1.6 GM/177ML PO SOLN: 1.0000 | Freq: Once | ORAL | 0 refills | Status: AC

## 2022-08-19 NOTE — Progress Notes (Signed)
No egg or soy allergy known to patient  No issues known to pt with past sedation with any surgeries or procedures Patient denies ever being told they had issues or difficulty with intubation  No FH of Malignant Hyperthermia Pt is not on diet pills Pt is not on  home 02  Pt is not on blood thinners  Pt denies issues with constipation  No A fib or A flutter Have any cardiac testing pending--no Pt instructed to use [email or web address].[email or web address] or GoodRx for a price reduction on prep   

## 2022-09-03 ENCOUNTER — Encounter: Payer: Medicare Other | Admitting: Gastroenterology

## 2022-09-06 ENCOUNTER — Telehealth: Payer: Self-pay | Admitting: Gastroenterology

## 2022-09-06 NOTE — Telephone Encounter (Signed)
Inbound call from patient, wishing to reschedule his procedure for tomorrow at 8::30 AM. Patient states his transportation is no longer able to bring him. Rescheduled procedure for Friday 6/14 at 2:30 PM.

## 2022-09-06 NOTE — Telephone Encounter (Signed)
Okay thanks for letting me know, sorry to hear it

## 2022-09-07 ENCOUNTER — Encounter: Payer: 59 | Admitting: Gastroenterology

## 2022-10-15 ENCOUNTER — Ambulatory Visit (AMBULATORY_SURGERY_CENTER): Payer: 59 | Admitting: Gastroenterology

## 2022-10-15 ENCOUNTER — Encounter: Payer: Self-pay | Admitting: Gastroenterology

## 2022-10-15 VITALS — BP 167/97 | HR 73 | Temp 97.7°F | Resp 15 | Ht 67.0 in | Wt 279.0 lb

## 2022-10-15 DIAGNOSIS — D123 Benign neoplasm of transverse colon: Secondary | ICD-10-CM

## 2022-10-15 DIAGNOSIS — D120 Benign neoplasm of cecum: Secondary | ICD-10-CM

## 2022-10-15 DIAGNOSIS — Z1211 Encounter for screening for malignant neoplasm of colon: Secondary | ICD-10-CM | POA: Diagnosis not present

## 2022-10-15 DIAGNOSIS — D122 Benign neoplasm of ascending colon: Secondary | ICD-10-CM | POA: Diagnosis not present

## 2022-10-15 MED ORDER — SODIUM CHLORIDE 0.9 % IV SOLN: 500.0000 mL | Freq: Once | INTRAVENOUS | Status: DC

## 2022-10-15 NOTE — Progress Notes (Signed)
Pt's states no medical or surgical changes since previsit or office visit. 

## 2022-10-15 NOTE — Op Note (Signed)
[facility] Patient Name: [name] Procedure Date: 10/15/2022 1:56 PM MRN: [id number] Endoscopist: Viviann Spare P. Adela Lank , MD, [id number] Age: 67 Referring MD:  Date of Birth: [birth year] Gender: Male Account #: [id number] Procedure:                Colonoscopy Indications:              Screening for colorectal malignant neoplasm Medicines:                Monitored Anesthesia Care Procedure:                Pre-Anesthesia Assessment:                           - Prior to the procedure, a History and Physical                            was performed, and patient medications and                            allergies were reviewed. The patient's tolerance of                            previous anesthesia was also reviewed. The risks                            and benefits of the procedure and the sedation                            options and risks were discussed with the patient.                            All questions were answered, and informed consent                            was obtained. Prior Anticoagulants: The patient has                            taken no anticoagulant or antiplatelet agents. ASA                            Grade Assessment: III - A patient with severe                            systemic disease. After reviewing the risks and                            benefits, the patient was deemed in satisfactory                            condition to undergo the procedure.                           After obtaining informed consent, the colonoscope  was passed under direct vision. Throughout the                            procedure, the patient's blood pressure, pulse, and                            oxygen saturations were monitored continuously. The                            CF [id number] #[id number] was introduced through the anus                            and advanced to the the cecum, identified by                            appendiceal  orifice and ileocecal valve. The                            colonoscopy was performed without difficulty. The                            patient tolerated the procedure well. The quality                            of the bowel preparation was adequate. The                            ileocecal valve, appendiceal orifice, and rectum                            were photographed. Scope In: 2:12:48 PM Scope Out: 2:36:50 PM Scope Withdrawal Time: 0 hours 19 minutes 26 seconds  Total Procedure Duration: 0 hours 24 minutes 2 seconds  Findings:                 The perianal and digital rectal examinations were                            normal.                           A diminutive polyp was found in the cecum. The                            polyp was sessile. The polyp was removed with a                            cold snare. Resection and retrieval were complete.                           Two sessile polyps were found in the ascending                            colon. The polyps were 2 to 4 mm in size. These  polyps were removed with a cold snare. Resection                            and retrieval were complete.                           A 4 mm polyp was found in the transverse colon. The                            polyp was sessile. The polyp was removed with a                            cold snare. Resection and retrieval were complete.                           A large polyp was found in the transverse colon.                            The polyp was semi-pedunculated. It was not removed                            in the office setting today given risks for                            bleeding. Biopsies were taken with a cold forceps                            for histology. Area across from polyp was tattooed                            with an injection of Spot (carbon black).                           Scattered small-mouthed diverticula were found in                             the transverse colon and right colon.                           Internal hemorrhoids were found during retroflexion.                           The exam was otherwise without abnormality. Prep                            was adequate but several minutes spent lavaging the                            colon to achieve adequate views. Complications:            No immediate complications. Estimated blood loss:                            Minimal. Estimated  Blood Loss:     Estimated blood loss was minimal. Impression:               - One diminutive polyp in the cecum, removed with a                            cold snare. Resected and retrieved.                           - Two 2 to 4 mm polyps in the ascending colon,                            removed with a cold snare. Resected and retrieved.                           - One 4 mm polyp in the transverse colon, removed                            with a cold snare. Resected and retrieved.                           - One large polyp in the transverse colon.                            Biopsied. Tattooed.                           - Diverticulosis in the transverse colon and in the                            right colon.                           - Internal hemorrhoids.                           - The examination was otherwise normal. Recommendation:           - Patient has a contact number available for                            emergencies. The signs and symptoms of potential                            delayed complications were discussed with the                            patient. Return to normal activities tomorrow.                            Written discharge instructions were provided to the                            patient.                           -  Resume previous diet.                           - Continue present medications.                           - Await pathology results.                           - Anticipate  polypectomy of large transverse polyp                            in the hospital setting, will discuss with the                            patient Willaim Rayas. Tametha Banning, MD 10/15/2022 2:43:33 PM This report has been signed electronically.

## 2022-10-15 NOTE — Patient Instructions (Signed)
Discharge instructions given. Handouts on polyps,diverticulosis and hemorrhoids. Resume previous medications. YOU HAD AN ENDOSCOPIC PROCEDURE TODAY AT THE [facility]:   Refer to the procedure report that was given to you for any specific questions about what was found during the examination.  If the procedure report does not answer your questions, please call your gastroenterologist to clarify.  If you requested that your care partner not be given the details of your procedure findings, then the procedure report has been included in a sealed envelope for you to review at your convenience later.  YOU SHOULD EXPECT: Some feelings of bloating in the abdomen. Passage of more gas than usual.  Walking can help get rid of the air that was put into your GI tract during the procedure and reduce the bloating. If you had a lower endoscopy (such as a colonoscopy or flexible sigmoidoscopy) you may notice spotting of blood in your stool or on the toilet paper. If you underwent a bowel prep for your procedure, you may not have a normal bowel movement for a few days.  Please Note:  You might notice some irritation and congestion in your nose or some drainage.  This is from the oxygen used during your procedure.  There is no need for concern and it should clear up in a day or so.  SYMPTOMS TO REPORT IMMEDIATELY:  Following lower endoscopy (colonoscopy or flexible sigmoidoscopy):  Excessive amounts of blood in the stool  Significant tenderness or worsening of abdominal pains  Swelling of the abdomen that is new, acute  Fever of 100F or higher   For urgent or emergent issues, a gastroenterologist can be reached at any hour by calling [phone]. Do not use MyChart messaging for urgent concerns.    DIET:  We do recommend a small meal at first, but then you may proceed to your regular diet.  Drink plenty of fluids but you should avoid alcoholic beverages for 24 hours.  ACTIVITY:  You should  plan to take it easy for the rest of today and you should NOT DRIVE or use heavy machinery until tomorrow (because of the sedation medicines used during the test).    FOLLOW UP: Our staff will call the number listed on your records the next business day following your procedure.  We will call around 7:15- 8:00 am to check on you and address any questions or concerns that you may have regarding the information given to you following your procedure. If we do not reach you, we will leave a message.     If any biopsies were taken you will be contacted by phone or by letter within the next 1-3 weeks.  Please call us at [phone] if you have not heard about the biopsies in 3 weeks.    SIGNATURES/CONFIDENTIALITY: You and/or your care partner have signed paperwork which will be entered into your electronic medical record.  These signatures attest to the fact that that the information above on your After Visit Summary has been reviewed and is understood.  Full responsibility of the confidentiality of this discharge information lies with you and/or your care-partner. 

## 2022-10-15 NOTE — Progress Notes (Signed)
OAW used to relieve obstruction during procedure; O2 via face mask used. Uneventful anesthetic. Patient able to maintain airway with no supplement, assistance, or O2 in pacu. Report to pacu rn. Vss. Care resumed by rn.

## 2022-10-15 NOTE — Progress Notes (Signed)
Called to room to assist during endoscopic procedure.  Patient ID and intended procedure confirmed with present staff. Received instructions for my participation in the procedure from the performing physician.  

## 2022-10-15 NOTE — Progress Notes (Signed)
[facility] History and Physical   Primary Care Physician:  Fleet Contras, MD   Reason for Procedure:   Colon cancer screening  Plan:    colonoscopy     HPI: [name] is a 67 y.o. male  here for colonoscopy screening . Last exam he thinks 12-13 years ago and normal. He endorses chronic constipation. No family history of colon cancer known. Otherwise feels well without any cardiopulmonary symptoms.   I have discussed risks / benefits of anesthesia and endoscopic procedure with [name] and they wish to proceed with the exams as outlined today.    Past Medical History:  Diagnosis Date   Acid reflux    Anxiety    Arthritis    Asthma    Borderline diabetic    Diabetes mellitus without complication (HCC)    Hyperchloremia    Hyperlipidemia    Hypertension    Restless leg syndrome    Seasonal allergies    Sleep apnea    does not wear CPAP   Spinal stenosis     Past Surgical History:  Procedure Laterality Date   BACK SURGERY     COLONOSCOPY     EXCISION MASS NECK N/A 09/16/2021   Procedure: EXCISION OF BENIGN SUPERFICIAL POSTERIOR MASS NECK;  Surgeon: Laren Boom, DO;  Location: [facility] OR;  Service: ENT;  Laterality: N/A;   GASTRIC RESECTION     LUMBAR DISC SURGERY     REPLACEMENT TOTAL KNEE     umbilical surgery  2023    Prior to Admission medications   Medication Sig Start Date End Date Taking? Authorizing Provider  aspirin 81 MG chewable tablet Chew 1 tablet (81 mg total) by mouth daily. 02/15/15  Yes Jeralyn Bennett, MD  Cholecalciferol (VITAMIN D) 125 MCG (5000 UT) CAPS Take by mouth.   Yes [provider]  colchicine 0.6 MG tablet Take 0.6 mg by mouth daily.   Yes [provider]  cyclobenzaprine (FLEXERIL) 10 MG tablet Take 10 mg by mouth 2 (two) times daily.   Yes [provider]  doxazosin (CARDURA) 8 MG tablet Take 8 mg by mouth every morning.    Yes [provider]  esomeprazole (NEXIUM) 40 MG  capsule Take 40 mg by mouth daily. 09/07/21  Yes [provider]  fexofenadine (ALLEGRA) 180 MG tablet Allergy Relief (fexofenadine) 180 mg tablet TAKE 1 TABLET BY MOUTH EVERY DAY AS NEEDED 07/31/21  Yes [provider]  fluticasone (FLONASE) 50 MCG/ACT nasal spray SMARTSIG:2 Spray(s) Both Nares Every Evening 07/05/22  Yes [provider]  Fluticasone-Umeclidin-Vilant (TRELEGY ELLIPTA) 200-62.5-25 MCG/ACT AEPB Inhale 2 puffs into the lungs daily.   Yes [provider]  gabapentin (NEURONTIN) 100 MG capsule Take 100 mg by mouth 3 (three) times daily. 07/02/22  Yes [provider]  hydrALAZINE (APRESOLINE) 50 MG tablet Take 50 mg by mouth 2 (two) times daily.   Yes [provider]  metFORMIN (GLUCOPHAGE) 500 MG tablet Take 500 mg by mouth daily.   Yes [provider]  oxyCODONE-acetaminophen (PERCOCET/ROXICET) 5-325 MG tablet Take 1 tablet by mouth 3 (three) times daily as needed. 04/30/22  Yes [provider]  oxymetazoline (AFRIN) 0.05 % nasal spray Place 2 sprays into both nostrils 2 (two) times daily as needed for congestion.   Yes [provider]  pravastatin (PRAVACHOL) 20 MG tablet Take 20 mg by mouth daily.     Yes [provider]  sitaGLIPtin (JANUVIA) 100 MG tablet Take 100  mg by mouth daily.   Yes [provider]  albuterol (PROVENTIL) (2.5 MG/3ML) 0.083% nebulizer solution albuterol sulfate 2.5 mg/3 mL (0.083 %) solution for nebulization USE 1 UNIT VIA NEBULIZER EVERY 6 HOURS AS NEEDED FOR COPD 07/31/21   [provider]  nabumetone (RELAFEN) 500 MG tablet Take 500 mg by mouth 2 (two) times daily as needed. 09/24/22   [provider]  tadalafil (CIALIS) 20 MG tablet SMARTSIG:0.5-1 Tablet(s) By Mouth As Directed 09/17/22   [provider]    Current Outpatient Medications  Medication Sig Dispense Refill   aspirin 81 MG chewable tablet Chew 1 tablet (81 mg total) by mouth  daily. 30 tablet 0   Cholecalciferol (VITAMIN D) 125 MCG (5000 UT) CAPS Take by mouth.     colchicine 0.6 MG tablet Take 0.6 mg by mouth daily.     cyclobenzaprine (FLEXERIL) 10 MG tablet Take 10 mg by mouth 2 (two) times daily.     doxazosin (CARDURA) 8 MG tablet Take 8 mg by mouth every morning.      esomeprazole (NEXIUM) 40 MG capsule Take 40 mg by mouth daily.     fexofenadine (ALLEGRA) 180 MG tablet Allergy Relief (fexofenadine) 180 mg tablet TAKE 1 TABLET BY MOUTH EVERY DAY AS NEEDED     fluticasone (FLONASE) 50 MCG/ACT nasal spray SMARTSIG:2 Spray(s) Both Nares Every Evening     Fluticasone-Umeclidin-Vilant (TRELEGY ELLIPTA) 200-62.5-25 MCG/ACT AEPB Inhale 2 puffs into the lungs daily.     gabapentin (NEURONTIN) 100 MG capsule Take 100 mg by mouth 3 (three) times daily.     hydrALAZINE (APRESOLINE) 50 MG tablet Take 50 mg by mouth 2 (two) times daily.     metFORMIN (GLUCOPHAGE) 500 MG tablet Take 500 mg by mouth daily.     oxyCODONE-acetaminophen (PERCOCET/ROXICET) 5-325 MG tablet Take 1 tablet by mouth 3 (three) times daily as needed.     oxymetazoline (AFRIN) 0.05 % nasal spray Place 2 sprays into both nostrils 2 (two) times daily as needed for congestion.     pravastatin (PRAVACHOL) 20 MG tablet Take 20 mg by mouth daily.       sitaGLIPtin (JANUVIA) 100 MG tablet Take 100 mg by mouth daily.     albuterol (PROVENTIL) (2.5 MG/3ML) 0.083% nebulizer solution albuterol sulfate 2.5 mg/3 mL (0.083 %) solution for nebulization USE 1 UNIT VIA NEBULIZER EVERY 6 HOURS AS NEEDED FOR COPD     nabumetone (RELAFEN) 500 MG tablet Take 500 mg by mouth 2 (two) times daily as needed.     tadalafil (CIALIS) 20 MG tablet SMARTSIG:0.5-1 Tablet(s) By Mouth As Directed     Current Facility-Administered Medications  Medication Dose Route Frequency Provider Last Rate Last Admin   0.9 %  sodium chloride infusion  500 mL Intravenous Once Sidhant Helderman, Willaim Rayas, MD        Allergies as of 10/15/2022   (No Known  Allergies)    Family History  Problem Relation Age of Onset   Heart attack Mother        hx 2 heart attacks   Colon cancer Neg Hx    Colon polyps Neg Hx    Esophageal cancer Neg Hx     Social History   Socioeconomic History   Marital status: Married    Spouse name: Not on file   Number of children: Not on file   Years of education: Not on file   Highest education level: Not on file  Occupational History   Not on file  Tobacco Use   Smoking status: Former    Types: Cigarettes    Quit date: 07/30/1997    Years since quitting: 25.2   Smokeless tobacco: Never  Vaping Use   Vaping Use: Never used  Substance and Sexual Activity   Alcohol use: No   Drug use: Not Currently    Types: Marijuana   Sexual activity: Never  Other Topics Concern   Not on file  Social History Narrative   Not on file   Social Determinants of Health   Financial Resource Strain: Not on file  Food Insecurity: Not on file  Transportation Needs: Not on file  Physical Activity: Not on file  Stress: Not on file  Social Connections: Not on file  Intimate Partner Violence: Not on file    Review of Systems: All other review of systems negative except as mentioned in the HPI.  Physical Exam: Vital signs BP (!) 146/86   Pulse 76   Temp 97.7 F (36.5 C) (Temporal)   Resp 19   Ht 5\' 7"  (1.702 m)   Wt 279 lb (126.6 kg)   SpO2 96%   BMI 43.70 kg/m   General:   Alert,  Well-developed, pleasant and cooperative in NAD Lungs:  Clear throughout to auscultation.   Heart:  Regular rate and rhythm Abdomen:  Soft, nontender and nondistended.   Neuro/Psych:  Alert and cooperative. Normal mood and affect. A and O x 3  Harlin Rain, MD [facility]

## 2022-10-18 ENCOUNTER — Telehealth: Payer: Self-pay | Admitting: *Deleted

## 2022-10-18 NOTE — Telephone Encounter (Signed)
  Follow up Call-     10/15/2022    1:33 PM  Call back number  Post procedure Call Back phone  # [phone]  Permission to leave phone message Yes     Patient questions:  Do you have a fever, pain , or abdominal swelling? No. Pain Score  0 *  Have you tolerated food without any problems? Yes.    Have you been able to return to your normal activities? Yes.    Do you have any questions about your discharge instructions: Diet   No. Medications  No. Follow up visit  No.  Do you have questions or concerns about your Care? No.  Actions: * If pain score is 4 or above: No action needed, pain <4.

## 2022-10-25 ENCOUNTER — Other Ambulatory Visit: Payer: Self-pay

## 2022-10-25 DIAGNOSIS — Z86010 Personal history of colonic polyps: Secondary | ICD-10-CM

## 2022-10-25 NOTE — Progress Notes (Signed)
Colon at [facility] on 9-12 at 10:15am Case #[id number]

## 2022-12-20 ENCOUNTER — Other Ambulatory Visit: Payer: Self-pay | Admitting: Internal Medicine

## 2022-12-21 LAB — COMPLETE METABOLIC PANEL WITH GFR
AG Ratio: 1.7 (calc) (ref 1.0–2.5)
ALT: 24 U/L (ref 9–46)
AST: 24 U/L (ref 10–35)
Albumin: 4.5 g/dL (ref 3.6–5.1)
Alkaline phosphatase (APISO): 44 U/L (ref 35–144)
BUN: 15 mg/dL (ref 7–25)
CO2: 24 mmol/L (ref 20–32)
Calcium: 10.2 mg/dL (ref 8.6–10.3)
Chloride: 105 mmol/L (ref 98–110)
Creat: 1.14 mg/dL (ref 0.70–1.35)
Globulin: 2.6 g/dL (ref 1.9–3.7)
Glucose, Bld: 108 mg/dL — ABNORMAL HIGH (ref 65–99)
Potassium: 4.4 mmol/L (ref 3.5–5.3)
Sodium: 141 mmol/L (ref 135–146)
Total Bilirubin: 0.4 mg/dL (ref 0.2–1.2)
Total Protein: 7.1 g/dL (ref 6.1–8.1)
eGFR: 71 mL/min/{1.73_m2} (ref >=60)

## 2022-12-21 LAB — CBC
HCT: 42.4 % (ref 38.5–50.0)
Hemoglobin: 14.1 g/dL (ref 13.2–17.1)
MCH: 27.6 pg (ref 27.0–33.0)
MCHC: 33.3 g/dL (ref 32.0–36.0)
MCV: 83 fL (ref 80.0–100.0)
MPV: 10.8 fL (ref 7.5–12.5)
Platelets: 236 10*3/uL (ref 140–400)
RBC: 5.11 10*6/uL (ref 4.20–5.80)
RDW: 13.6 % (ref 11.0–15.0)
WBC: 4.2 10*3/uL (ref 3.8–10.8)

## 2022-12-21 LAB — URINE CULTURE
MICRO NUMBER:: 15350711
Result:: NO GROWTH
SPECIMEN QUALITY:: ADEQUATE

## 2022-12-21 LAB — LIPID PANEL
Cholesterol: 174 mg/dL (ref <200)
HDL: 44 mg/dL (ref >=40)
LDL Cholesterol (Calc): 102 mg/dL — ABNORMAL HIGH
Non-HDL Cholesterol (Calc): 130 mg/dL — ABNORMAL HIGH (ref <130)
Total CHOL/HDL Ratio: 4 (calc) (ref <5.0)
Triglycerides: 164 mg/dL — ABNORMAL HIGH (ref <150)

## 2022-12-21 LAB — PSA: PSA: 2.18 ng/mL (ref <=4.00)

## 2022-12-21 LAB — TSH: TSH: 0.87 m[IU]/L (ref 0.40–4.50)

## 2022-12-21 LAB — VITAMIN D 25 HYDROXY (VIT D DEFICIENCY, FRACTURES): Vit D, 25-Hydroxy: 36 ng/mL (ref 30–100)

## 2022-12-28 ENCOUNTER — Ambulatory Visit (AMBULATORY_SURGERY_CENTER): Payer: 59 | Admitting: *Deleted

## 2022-12-28 VITALS — Ht 67.0 in | Wt 282.0 lb

## 2022-12-28 DIAGNOSIS — Z86010 Personal history of colonic polyps: Secondary | ICD-10-CM

## 2022-12-28 MED ORDER — NA SULFATE-K SULFATE-MG SULF 17.5-3.13-1.6 GM/177ML PO SOLN
1.0000 | Freq: Once | ORAL | 0 refills | Status: AC
Start: 2022-12-28 — End: 2022-12-28

## 2022-12-28 NOTE — Progress Notes (Signed)
Pt's name and DOB verified at the beginning of the pre-visit.  Pt denies any difficulty with ambulating,sitting, laying down or rolling side to side Gave both [facility] main # and MD on call # prior to instructions.  No egg or soy allergy known to patient  No issues known to pt with past sedation with any surgeries or procedures Pt denies having issues being intubated Pt has no issues moving head neck or swallowing No FH of Malignant Hyperthermia Pt is not on diet pills Pt is not on home 02  Pt is not on blood thinners  Pt has frequent issues with constipation RN instructed pt to use Miralax per bottles instructions a week before prep days. Pt states they will Pt is not on dialysis Pt denise any abnormal heart rhythms  Pt denies any upcoming cardiac testing Pt encouraged to use to use Singlecare or Goodrx to reduce cost  Patient's chart reviewed by Cathlyn Parsons CNRA prior to pre-visit and patient appropriate for the [facility].  Pre-visit completed and red dot placed by patient's name on their procedure day (on provider's schedule).  . Visit by phone Pt states weight is 282 lb Instructed pt why it is important to and  to call if they have any changes in health or new medications. Directed them to the # given and on instructions.   Pt states they will.  Instructions reviewed with pt and pt states understanding. Instructed to review again prior to procedure. Pt states they will.  Instructions sent by mail with coupon and by my chart

## 2023-01-06 ENCOUNTER — Encounter (HOSPITAL_COMMUNITY): Payer: Self-pay | Admitting: Gastroenterology

## 2023-01-13 ENCOUNTER — Encounter (HOSPITAL_COMMUNITY)
Admission: RE | Disposition: A | Discharge status: Home / Self Care | Payer: Self-pay | Source: Home / Self Care | Attending: Gastroenterology

## 2023-01-13 ENCOUNTER — Ambulatory Visit (HOSPITAL_COMMUNITY): Payer: 59 | Admitting: Anesthesiology

## 2023-01-13 ENCOUNTER — Encounter (HOSPITAL_COMMUNITY): Payer: Self-pay | Admitting: Gastroenterology

## 2023-01-13 ENCOUNTER — Ambulatory Visit (HOSPITAL_COMMUNITY)
Admission: RE | Admit: 2023-01-13 | Discharge: 2023-01-13 | Disposition: A | Discharge status: Home / Self Care | Payer: 59 | Attending: Gastroenterology | Admitting: Gastroenterology

## 2023-01-13 ENCOUNTER — Other Ambulatory Visit: Payer: Self-pay

## 2023-01-13 DIAGNOSIS — Z87891 Personal history of nicotine dependence: Secondary | ICD-10-CM | POA: Insufficient documentation

## 2023-01-13 DIAGNOSIS — K648 Other hemorrhoids: Secondary | ICD-10-CM | POA: Diagnosis not present

## 2023-01-13 DIAGNOSIS — D126 Benign neoplasm of colon, unspecified: Secondary | ICD-10-CM

## 2023-01-13 DIAGNOSIS — G4730 Sleep apnea, unspecified: Secondary | ICD-10-CM | POA: Diagnosis not present

## 2023-01-13 DIAGNOSIS — K5730 Diverticulosis of large intestine without perforation or abscess without bleeding: Secondary | ICD-10-CM | POA: Insufficient documentation

## 2023-01-13 DIAGNOSIS — D123 Benign neoplasm of transverse colon: Secondary | ICD-10-CM | POA: Diagnosis not present

## 2023-01-13 DIAGNOSIS — D120 Benign neoplasm of cecum: Secondary | ICD-10-CM

## 2023-01-13 DIAGNOSIS — E119 Type 2 diabetes mellitus without complications: Secondary | ICD-10-CM | POA: Insufficient documentation

## 2023-01-13 DIAGNOSIS — Z09 Encounter for follow-up examination after completed treatment for conditions other than malignant neoplasm: Secondary | ICD-10-CM

## 2023-01-13 DIAGNOSIS — I10 Essential (primary) hypertension: Secondary | ICD-10-CM | POA: Diagnosis not present

## 2023-01-13 DIAGNOSIS — Z8249 Family history of ischemic heart disease and other diseases of the circulatory system: Secondary | ICD-10-CM | POA: Insufficient documentation

## 2023-01-13 DIAGNOSIS — Z7984 Long term (current) use of oral hypoglycemic drugs: Secondary | ICD-10-CM | POA: Insufficient documentation

## 2023-01-13 DIAGNOSIS — Z860100 Personal history of colon polyps, unspecified: Secondary | ICD-10-CM

## 2023-01-13 DIAGNOSIS — K635 Polyp of colon: Secondary | ICD-10-CM | POA: Diagnosis present

## 2023-01-13 DIAGNOSIS — D125 Benign neoplasm of sigmoid colon: Secondary | ICD-10-CM | POA: Diagnosis not present

## 2023-01-13 HISTORY — PX: POLYPECTOMY: SHX5525

## 2023-01-13 HISTORY — PX: COLONOSCOPY WITH PROPOFOL: SHX5780

## 2023-01-13 HISTORY — PX: SUBMUCOSAL INJECTION: SHX5543

## 2023-01-13 HISTORY — PX: HEMOSTASIS CLIP PLACEMENT: SHX6857

## 2023-01-13 LAB — GLUCOSE, CAPILLARY: Glucose-Capillary: 98 mg/dL (ref 70–99)

## 2023-01-13 SURGERY — COLONOSCOPY WITH PROPOFOL
Anesthesia: Monitor Anesthesia Care

## 2023-01-13 MED ORDER — PROPOFOL 500 MG/50ML IV EMUL
INTRAVENOUS | Status: DC | PRN
  Administered 2023-01-13: 100 ug/kg/min via INTRAVENOUS

## 2023-01-13 MED ORDER — SODIUM CHLORIDE 0.9 % IV SOLN: INTRAVENOUS | Status: DC

## 2023-01-13 MED ORDER — LACTATED RINGERS IV SOLN
INTRAVENOUS | Status: AC | PRN
  Administered 2023-01-13: 1000 mL via INTRAVENOUS

## 2023-01-13 MED ORDER — PROPOFOL 10 MG/ML IV BOLUS
INTRAVENOUS | Status: DC | PRN
  Administered 2023-01-13: 80 mg via INTRAVENOUS

## 2023-01-13 MED ORDER — EPINEPHRINE 1 MG/10ML IJ SOSY
PREFILLED_SYRINGE | INTRAMUSCULAR | Status: AC
  Filled 2023-01-13: qty 10

## 2023-01-13 MED ORDER — EPINEPHRINE 1 MG/10ML IJ SOSY
PREFILLED_SYRINGE | INTRAMUSCULAR | Status: DC | PRN
Start: 2023-01-13 — End: 2023-01-13
  Administered 2023-01-13: .1 mg via INTRAVENOUS

## 2023-01-13 SURGICAL SUPPLY — 22 items

## 2023-01-13 NOTE — Anesthesia Postprocedure Evaluation (Signed)
Anesthesia Post Note  Patient: [name]  Procedure(s) Performed: COLONOSCOPY WITH PROPOFOL POLYPECTOMY SUBMUCOSAL INJECTION HEMOSTASIS CLIP PLACEMENT     Patient location during evaluation: PACU Anesthesia Type: MAC Level of consciousness: awake and alert Pain management: pain level controlled Vital Signs Assessment: post-procedure vital signs reviewed and stable Respiratory status: spontaneous breathing, nonlabored ventilation, respiratory function stable and patient connected to nasal cannula oxygen Cardiovascular status: stable and blood pressure returned to baseline Postop Assessment: no apparent nausea or vomiting Anesthetic complications: no  No notable events documented.  Last Vitals:  Vitals:   01/13/23 1040 01/13/23 1050  BP: (!) 147/85 139/85  Pulse: 81 73  Resp: 17 18  Temp:    SpO2: 96% 92%    Last Pain:  Vitals:   01/13/23 1050  TempSrc:   PainSc: 0-No pain                 Sharlene Mccluskey S

## 2023-01-13 NOTE — H&P (Signed)
[facility] History and Physical   Primary Care Physician:  Fleet Contras, MD   Reason for Procedure:   Polypectomy of large polyp - EMR  Plan:    Colonoscopy with polypectomy / EMR     HPI: [name] is a 67 y.o. male  here for colonoscopy to remove large polyp. Had colonoscopy at the office with me on 6/14. Large semipedunculated lesion in the transverse colon noted. 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 Did not remove at the time of the last exam in the office due to risks for bleeding. Have recommended polypectomy / EMR at the hospital setting. He denies complaints today and otherwise feels well. He wishes to proceed with the exam as outlined. He understands there is a risk for bleeding and perforation to his colon with removal of large growth such as this. He wishes to proceed after full discussion of risks / benefits. Further recommendations pending the results.     Past Medical History:  Diagnosis Date   Acid reflux    Anxiety    Arthritis    Asthma    Borderline diabetic    Diabetes mellitus without complication (HCC)    Hyperchloremia    Hyperlipidemia    Hypertension    Panic attacks    Restless leg syndrome    Seasonal allergies    Sleep apnea    does not wear CPAP   Spinal stenosis     Past Surgical History:  Procedure Laterality Date   BACK SURGERY     COLONOSCOPY     EXCISION MASS NECK N/A 09/16/2021   Procedure: EXCISION OF BENIGN SUPERFICIAL POSTERIOR MASS NECK;  Surgeon: Laren Boom, DO;  Location: [facility] OR;  Service: ENT;  Laterality: N/A;   GASTRIC RESECTION     LUMBAR DISC SURGERY     REPLACEMENT TOTAL KNEE     umbilical surgery  2023    Prior to Admission medications   Medication Sig Start Date End Date Taking? Authorizing Provider  aspirin 81 MG chewable tablet Chew 1 tablet (81 mg total) by mouth daily. 02/15/15  Yes Jeralyn Bennett, MD  b complex vitamins capsule Take 1 capsule by mouth daily.   Yes  [provider]  Cholecalciferol (VITAMIN D) 125 MCG (5000 UT) CAPS Take by mouth.   Yes [provider]  colchicine 0.6 MG tablet Take 0.6 mg by mouth daily.   Yes [provider]  cyclobenzaprine (FLEXERIL) 10 MG tablet Take 10 mg by mouth 2 (two) times daily.   Yes [provider]  doxazosin (CARDURA) 8 MG tablet Take 8 mg by mouth every morning.    Yes [provider]  esomeprazole (NEXIUM) 40 MG capsule Take 40 mg by mouth daily. 09/07/21  Yes [provider]  Fluticasone-Umeclidin-Vilant (TRELEGY ELLIPTA) 200-62.5-25 MCG/ACT AEPB Inhale 2 puffs into the lungs daily.   Yes [provider]  gabapentin (NEURONTIN) 100 MG capsule Take 100 mg by mouth 3 (three) times daily. 07/02/22  Yes [provider]  hydrALAZINE (APRESOLINE) 50 MG tablet Take 50 mg by mouth 2 (two) times daily.   Yes [provider]  metFORMIN (GLUCOPHAGE) 500 MG tablet Take 500 mg by mouth daily.   Yes [provider]  nabumetone (RELAFEN) 500 MG tablet Take 500 mg by mouth 2 (two) times daily as needed. 09/24/22  Yes [provider]  nitrofurantoin, macrocrystal-monohydrate, (MACROBID) 100 MG capsule Take 100 mg by mouth 2 (two) times daily. 12/20/22  Yes [provider]  oxymetazoline (AFRIN) 0.05 % nasal spray Place 2 sprays into both nostrils 2 (two) times daily as needed for congestion.   Yes [provider]  pravastatin (PRAVACHOL) 20 MG tablet Take 20 mg by mouth daily.     Yes [provider]  sitaGLIPtin (JANUVIA) 100 MG tablet Take 100 mg by mouth daily.   Yes [provider]  tadalafil (CIALIS) 20 MG tablet SMARTSIG:0.5-1 Tablet(s) By Mouth As Directed 09/17/22  Yes [provider]  vitamin C (ASCORBIC ACID) 250 MG tablet Take 250 mg by mouth daily.   Yes [provider]  albuterol (PROVENTIL) (2.5 MG/3ML) 0.083% nebulizer solution albuterol sulfate 2.5 mg/3 mL (0.083 %)  solution for nebulization USE 1 UNIT VIA NEBULIZER EVERY 6 HOURS AS NEEDED FOR COPD 07/31/21   [provider]  fexofenadine (ALLEGRA) 180 MG tablet Allergy Relief (fexofenadine) 180 mg tablet TAKE 1 TABLET BY MOUTH EVERY DAY AS NEEDED Patient not taking: Reported on 12/28/2022 07/31/21   [provider]  fluticasone Aleda Grana) 50 MCG/ACT nasal spray SMARTSIG:2 Spray(s) Both Nares Every Evening Patient not taking: Reported on 12/28/2022 07/05/22   [provider]  oxyCODONE-acetaminophen (PERCOCET/ROXICET) 5-325 MG tablet Take 1 tablet by mouth 3 (three) times daily as needed. Patient not taking: Reported on 12/28/2022 04/30/22   [provider]    Current Facility-Administered Medications  Medication Dose Route Frequency Provider Last Rate Last Admin   0.9 %  sodium chloride infusion   Intravenous Continuous Maliq Pilley, Willaim Rayas, MD       lactated ringers infusion    Continuous PRN Adela Lank, Willaim Rayas, MD 10 mL/hr at 01/13/23 0850 1,000 mL at 01/13/23 0850    Allergies as of 10/25/2022   (No Known Allergies)    Family History  Problem Relation Age of Onset   Heart attack Mother        hx 2 heart attacks   Colon cancer Neg Hx    Colon polyps Neg Hx    Esophageal cancer Neg Hx    Rectal cancer Neg Hx    Stomach cancer Neg Hx     Social History   Socioeconomic History   Marital status: Married    Spouse name: Not on file   Number of children: Not on file   Years of education: Not on file   Highest education level: Not on file  Occupational History   Not on file  Tobacco Use   Smoking status: Former    Current packs/day: 0.00    Types: Cigarettes    Quit date: 07/30/1997    Years since quitting: 25.4   Smokeless tobacco: Never  Vaping Use   Vaping status: Never Used  Substance and Sexual Activity   Alcohol use: No   Drug use: Not Currently    Types: Marijuana   Sexual activity: Never  Other Topics Concern   Not on file  Social History  Narrative   Not on file   Social Determinants of Health   Financial Resource Strain: Not on file  Food Insecurity: Not on file  Transportation Needs: Not on file  Physical Activity: Not on file  Stress: Not on file  Social Connections: Not on file  Intimate Partner Violence: Not on file    Review of Systems: All other review of systems negative except as mentioned in the HPI.  Physical Exam: Vital signs BP (!) 153/89   Pulse 88   Temp 98.5 F (36.9 C) (Tympanic)   Resp 17  Ht 5\' 7"  (1.702 m)   Wt 127 kg   SpO2 95%   BMI 43.85 kg/m   General:   Alert,  Well-developed, pleasant and cooperative in NAD Lungs:  Clear throughout to auscultation.   Heart:  Regular rate and rhythm Abdomen:  Soft, nontender and nondistended.   Neuro/Psych:  Alert and cooperative. Normal mood and affect. A and O x 3  Harlin Rain, MD [facility]

## 2023-01-13 NOTE — Discharge Instructions (Signed)

## 2023-01-13 NOTE — Transfer of Care (Signed)
Immediate Anesthesia Transfer of Care Note  Patient: [name]  Procedure(s) Performed: COLONOSCOPY WITH PROPOFOL POLYPECTOMY SUBMUCOSAL INJECTION HEMOSTASIS CLIP PLACEMENT  Patient Location: PACU and Endoscopy Unit  Anesthesia Type:MAC  Level of Consciousness: awake, alert , and oriented  Airway & Oxygen Therapy: Patient Spontanous Breathing  Post-op Assessment: Report given to RN and Post -op Vital signs reviewed and stable  Post vital signs: Reviewed and stable  Last Vitals:  Vitals Value Taken Time  BP    Temp    Pulse 98 01/13/23 1030  Resp 18 01/13/23 1030  SpO2 98 % 01/13/23 1030  Vitals shown include unfiled device data.  Last Pain:  Vitals:   01/13/23 0830  TempSrc: Tympanic  PainSc: 0-No pain         Complications: No notable events documented.

## 2023-01-13 NOTE — Anesthesia Preprocedure Evaluation (Signed)
Anesthesia Evaluation  Patient identified by MRN, date of birth, ID band Patient awake    Reviewed: Allergy & Precautions, H&P , NPO status , Patient's Chart, lab work & pertinent test results  Airway Mallampati: III  TM Distance: <3 FB Neck ROM: Full    Dental no notable dental hx.    Pulmonary asthma , sleep apnea , former smoker   breath sounds clear to auscultation + decreased breath sounds      Cardiovascular hypertension, Normal cardiovascular exam Rhythm:Regular Rate:Normal     Neuro/Psych negative neurological ROS  negative psych ROS   GI/Hepatic Neg liver ROS,GERD  ,,  Endo/Other  diabetes  Morbid obesity  Renal/GU negative Renal ROS  negative genitourinary   Musculoskeletal negative musculoskeletal ROS (+)    Abdominal  (+) + obese  Peds negative pediatric ROS (+)  Hematology negative hematology ROS (+)   Anesthesia Other Findings   Reproductive/Obstetrics negative OB ROS                             Anesthesia Physical Anesthesia Plan  ASA: 3  Anesthesia Plan: MAC   Post-op Pain Management:    Induction: Intravenous  PONV Risk Score and Plan: 1 and Propofol infusion and Treatment may vary due to age or medical condition  Airway Management Planned: Simple Face Mask  Additional Equipment:   Intra-op Plan:   Post-operative Plan:   Informed Consent: I have reviewed the patients History and Physical, chart, labs and discussed the procedure including the risks, benefits and alternatives for the proposed anesthesia with the patient or authorized representative who has indicated his/her understanding and acceptance.     Dental advisory given  Plan Discussed with: CRNA and Surgeon  Anesthesia Plan Comments:        Anesthesia Quick Evaluation

## 2023-01-13 NOTE — Op Note (Addendum)
[facility] Patient Name: [name] Procedure Date: 01/13/2023 MRN: [id number] Attending MD: Willaim Rayas. Adela Lank , MD, [id number] Date of Birth: 1955-10-01 CSN: [id number] Age: 67 Admit Type: Outpatient Procedure:                Colonoscopy Indications:              Removal of large transverse colon polyp noted on                            last colonoscopy. Providers:                Willaim Rayas. Adela Lank, MD, Lorenza Evangelist, RN,                            Beryle Beams, Technician, Stephanie [place], CRNA Referring MD:              Medicines:                Monitored Anesthesia Care Complications:            No immediate complications. Estimated blood loss:                            Minimal. Estimated Blood Loss:     Estimated blood loss was minimal. Procedure:                Pre-Anesthesia Assessment:                           - Prior to the procedure, a History and Physical                            was performed, and patient medications and                            allergies were reviewed. The patient's tolerance of                            previous anesthesia was also reviewed. The risks                            and benefits of the procedure and the sedation                            options and risks were discussed with the patient.                            All questions were answered, and informed consent                            was obtained. Prior Anticoagulants: The patient has                            taken no anticoagulant or antiplatelet agents. ASA  Grade Assessment: III - A patient with severe                            systemic disease. After reviewing the risks and                            benefits, the patient was deemed in satisfactory                            condition to undergo the procedure.                           After obtaining informed consent, the colonoscope                            was  passed under direct vision. Throughout the                            procedure, the patient's blood pressure, pulse, and                            oxygen saturations were monitored continuously. The                            [id number]-HQ190L ([id number]) Olympus colonoscope was                            introduced through the anus and advanced to the the                            cecum, identified by appendiceal orifice and                            ileocecal valve. The colonoscopy was performed                            without difficulty. The patient tolerated the                            procedure well. The quality of the bowel                            preparation was fair. The ileocecal valve,                            appendiceal orifice, and rectum were photographed. Scope In: 9:50:05 AM Scope Out: 10:22:26 AM Scope Withdrawal Time: 0 hours 24 minutes 54 seconds  Total Procedure Duration: 0 hours 32 minutes 21 seconds  Findings:      The perianal and digital rectal examinations were normal.      A 3 mm polyp was found in the cecum. The polyp was sessile. The polyp       was removed with a cold snare. Resection and retrieval were complete.      A diminutive polyp was found in the  hepatic flexure. The polyp was       sessile. The polyp was removed with a cold snare. Resection and       retrieval were complete.      An 8 mm polyp was found in the hepatic flexure. The polyp was sessile       and on the back side of the fold - was very difficult to visualize and       not seen on the last exam. The polyp was removed with a cold snare.       Resection and retrieval were complete.      A large polyp was found in the distal transverse colon. The polyp was       pedunculated with a thick stalk. I could visualize the base of it better       than on the last initial exam. Area was successfully injected with 3.5       mL of a 1:100,000 solution of epinephrine for drug delivery during cecal        intubation. Upon withdrawal the polyp head had shrunk. The polyp was       removed with a hot snare in one piece. Resection and retrieval were       complete. To prevent bleeding after the polypectomy given the size of       the stalk feeding this polyp, three hemostatic clips were successfully       placed across the polypectomy site to close it.      A 3 mm polyp was found in the sigmoid colon. The polyp was sessile.       Suspect benign / hyperplastic. The polyp was removed with a cold snare.       Resection and retrieval were complete.      Scattered small-mouthed diverticula were found in the transverse colon       and right colon.      Internal hemorrhoids were found. The hemorrhoids were small.      The exam was otherwise without abnormality. Of note, fair prep in the       right colon requiring extensive lavage. This was also the case on his       last exam. Also, the colonoscope did not retroflex well - limited views       of the rectum in the retroflexed position. Impression:               - Preparation of the colon was fair requiring                            extensive lavage.                           - One 3 mm polyp in the cecum, removed with a cold                            snare. Resected and retrieved.                           - One diminutive polyp at the hepatic flexure,                            removed with a cold snare. Resected and retrieved.                           -  One 8 mm polyp at the hepatic flexure, removed                            with a cold snare. Resected and retrieved.                           - One large polyp in the distal transverse colon,                            removed as outlined above. Clips were placed.                           - One 3 mm polyp in the sigmoid colon, removed with                            a cold snare. Resected and retrieved.                           - Diverticulosis in the transverse colon and in the                             right colon.                           - Internal hemorrhoids.                           - The examination was otherwise normal.                           Recommend double prep for future exams given prep                            quality on the last 2 exams. Moderate Sedation:      No moderate sedation, case performed with MAC Recommendation:           - Patient has a contact number available for                            emergencies. The signs and symptoms of potential                            delayed complications were discussed with the                            patient. Return to normal activities tomorrow.                            Written discharge instructions were provided to the                            patient.                           - Resume previous diet.                           -  Continue present medications.                           - Await pathology results.                           - No ibuprofen, naproxen, or other non-steroidal                            anti-inflammatory drugs for 2 weeks after polyp                            removal. Procedure Code(s):        --- Professional ---                           [id number], Colonoscopy, flexible; with removal of                            tumor(s), polyp(s), or other lesion(s) by snare                            technique                           45381, Colonoscopy, flexible; with directed                            submucosal injection(s), any substance Diagnosis Code(s):        --- Professional ---                           K64.8, Other hemorrhoids                           D12.0, Benign neoplasm of cecum                           D12.3, Benign neoplasm of transverse colon (hepatic                            flexure or splenic flexure)                           D12.5, Benign neoplasm of sigmoid colon                           D12.6, Benign neoplasm of colon, unspecified                            K57.30, Diverticulosis of large intestine without                            perforation or abscess without bleeding CPT copyright 2022 [employer]. All rights reserved. The codes documented in this report are preliminary and upon coder review may  be revised to meet current compliance requirements. Viviann Spare P. Ryah Cribb, MD 01/13/2023 10:44:14 AM This report  has been signed electronically. Number of Addenda: 0

## 2023-01-17 ENCOUNTER — Encounter: Payer: Self-pay | Admitting: Gastroenterology

## 2023-01-17 ENCOUNTER — Encounter (HOSPITAL_COMMUNITY): Payer: Self-pay | Admitting: Gastroenterology

## 2023-01-17 LAB — SURGICAL PATHOLOGY

## 2024-02-01 ENCOUNTER — Other Ambulatory Visit (HOSPITAL_COMMUNITY): Payer: Self-pay | Admitting: Physician Assistant

## 2024-02-01 DIAGNOSIS — Z96653 Presence of artificial knee joint, bilateral: Secondary | ICD-10-CM

## 2024-02-17 ENCOUNTER — Ambulatory Visit (HOSPITAL_COMMUNITY): Admission: RE | Admit: 2024-02-17 | Source: Ambulatory Visit

## 2024-02-17 ENCOUNTER — Ambulatory Visit (HOSPITAL_COMMUNITY)

## 2024-02-21 ENCOUNTER — Other Ambulatory Visit: Payer: Self-pay | Admitting: Specialist

## 2024-02-21 DIAGNOSIS — M5416 Radiculopathy, lumbar region: Secondary | ICD-10-CM

## 2024-02-24 ENCOUNTER — Ambulatory Visit (HOSPITAL_COMMUNITY)

## 2024-02-24 ENCOUNTER — Ambulatory Visit (HOSPITAL_COMMUNITY): Admission: RE | Admit: 2024-02-24 | Source: Ambulatory Visit

## 2024-02-27 ENCOUNTER — Ambulatory Visit: Payer: Self-pay | Admitting: Orthopedic Surgery

## 2024-02-27 DIAGNOSIS — M48062 Spinal stenosis, lumbar region with neurogenic claudication: Secondary | ICD-10-CM

## 2024-03-09 ENCOUNTER — Ambulatory Visit (HOSPITAL_COMMUNITY)
Admission: RE | Admit: 2024-03-09 | Discharge: 2024-03-09 | Disposition: A | Discharge status: Home / Self Care | Source: Ambulatory Visit | Attending: Physician Assistant | Admitting: Physician Assistant

## 2024-03-09 ENCOUNTER — Other Ambulatory Visit (HOSPITAL_COMMUNITY): Payer: Self-pay | Admitting: Specialist

## 2024-03-09 DIAGNOSIS — Z96653 Presence of artificial knee joint, bilateral: Secondary | ICD-10-CM | POA: Diagnosis present

## 2024-03-09 DIAGNOSIS — I739 Peripheral vascular disease, unspecified: Secondary | ICD-10-CM

## 2024-03-09 DIAGNOSIS — M79605 Pain in left leg: Secondary | ICD-10-CM

## 2024-03-09 MED ORDER — TECHNETIUM TC 99M MEDRONATE IV KIT
20.0000 | PACK | Freq: Once | INTRAVENOUS | Status: AC | PRN
  Administered 2024-03-09: 21.9 via INTRAVENOUS

## 2024-03-09 NOTE — Pre-Procedure Instructions (Signed)
 Surgical Instructions   Your procedure is scheduled on March 15, 2024. Report to [facility] Main Entrance A at 5:30 A.M., then check in with the Admitting office. Any questions or running late day of surgery: call [phone]  Questions prior to your surgery date: call [phone], Monday-Friday, 8am-4pm. If you experience any cold or flu symptoms such as cough, fever, chills, shortness of breath, etc. between now and your scheduled surgery, please notify us  at the above number.     Remember:  Do not eat after midnight the night before your surgery  You may drink clear liquids until 4:30AM the morning of your surgery.   Clear liquids allowed are: Water, Non-Citrus Juices (without pulp), Carbonated Beverages, Clear Tea (no milk, honey, etc.), Black Coffee Only (NO MILK, CREAM OR POWDERED CREAMER of any kind), and Gatorade.  Patient Instructions  The night before surgery:  No food after midnight. ONLY clear liquids after midnight    The day of surgery (if you have diabetes): Drink ONE (1) 12 oz G2 given to you in your pre admission testing appointment by 4:30AM the morning of surgery. Drink in one sitting. Do not sip.  This drink was given to you during your hospital  pre-op appointment visit.  Nothing else to drink after completing the  12 oz bottle of G2.         If you have questions, please contact your surgeon's office.     Take these medicines the morning of surgery with A SIP OF WATER: colchicine  cyclobenzaprine  (FLEXERIL   doxazosin  (CARDURA )  esomeprazole (NEXIUM)  Fluticasone-Umeclidin-Vilant (TRELEGY ELLIPTA) 200-62.5-25 MCG/ACT AEPB  gabapentin (NEURONTIN)  hydrALAZINE  (APRESOLINE )  pravastatin  (PRAVACHOL )  levocetirizine (XYZAL)   May take these medicines IF NEEDED: albuterol  (PROVENTIL ) (2.5 MG/3ML) 0.083% nebulizer solution  oxymetazoline (AFRIN) 0.05 % nasal spray  oxyCODONE -acetaminophen  (PERCOCET/ROXICET) OR acetaminophen  (tylenol ) dicyclomine  (BENTYL)   Follow your surgeon's instructions on stopping Aspirin . If no instructions were given, please contact your surgeon's office.   One week prior to surgery, STOP taking any  Aleve, Naproxen, Ibuprofen, Motrin, Advil, Goody's, BC's, all herbal medications, fish oil, and non-prescription vitamins, including nabumetone (RELAFEN)           WHAT DO I DO ABOUT MY DIABETES MEDICATION?   Do not take oral diabetes medicines (pills) the morning of surgery. DO NOT TAKE metFORMIN (GLUCOPHAGE) or sitaGLIPtin (JANUVIA the day of surgery  HOLD Mounjaro for 7 days prior to surgery. DO NOT take after 03/07/24   HOW TO MANAGE YOUR DIABETES BEFORE AND AFTER SURGERY  Why is it important to control my blood sugar before and after surgery? Improving blood sugar levels before and after surgery helps healing and can limit problems. A way of improving blood sugar control is eating a healthy diet by:  Eating less sugar and carbohydrates  Increasing activity/exercise  Talking with your doctor about reaching your blood sugar goals High blood sugars (greater than 180 mg/dL) can raise your risk of infections and slow your recovery, so you will need to focus on controlling your diabetes during the weeks before surgery. Make sure that the doctor who takes care of your diabetes knows about your planned surgery including the date and location.  How do I manage my blood sugar before surgery? Check your blood sugar at least 4 times a day, starting 2 days before surgery, to make sure that the level is not too high or low.  Check your blood sugar the morning of your surgery when you wake  up and every 2 hours until you get to the Short Stay unit.  If your blood sugar is less than 70 mg/dL, you will need to treat for low blood sugar: Do not take insulin . Treat a low blood sugar (less than 70 mg/dL) with  cup of clear juice (cranberry or apple), 4 glucose tablets, OR glucose gel. Recheck blood sugar in 15 minutes  after treatment (to make sure it is greater than 70 mg/dL). If your blood sugar is not greater than 70 mg/dL on recheck, call [phone] for further instructions. Report your blood sugar to the short stay nurse when you get to Short Stay.  If you are admitted to the hospital after surgery: Your blood sugar will be checked by the staff and you will probably be given insulin  after surgery (instead of oral diabetes medicines) to make sure you have good blood sugar levels. The goal for blood sugar control after surgery is 80-180 mg/dL.  Do NOT Smoke (Tobacco/Vaping) for 24 hours prior to your procedure.  If you use a CPAP at night, you may bring your mask/headgear for your overnight stay.   You will be asked to remove any contacts, glasses, piercing's, hearing aid's, dentures/partials prior to surgery. Please bring cases for these items if needed.    Patients discharged the day of surgery will not be allowed to drive home, and someone needs to stay with them for 24 hours.  SURGICAL WAITING ROOM VISITATION Patients may have no more than 2 support people in the waiting area - these visitors may rotate.   Pre-op nurse will coordinate an appropriate time for 1 ADULT support person, who may not rotate, to accompany patient in pre-op.  Children under the age of 54 must have an adult with them who is not the patient and must remain in the main waiting area with an adult.  If the patient needs to stay at the hospital during part of their recovery, the visitor guidelines for inpatient rooms apply.  Please refer to the [facility] website for the visitor guidelines for any additional information.   If you received a COVID test during your pre-op visit  it is requested that you wear a mask when out in public, stay away from anyone that may not be feeling well and notify your surgeon if you develop symptoms. If you have been in contact with anyone that has tested positive in the last 10 days please notify  you surgeon.      Pre-operative 4 CHG Bathing Instructions   You can play a key role in reducing the risk of infection after surgery. Your skin needs to be as free of germs as possible. You can reduce the number of germs on your skin by washing with CHG (chlorhexidine  gluconate) soap before surgery. CHG is an antiseptic soap that kills germs and continues to kill germs even after washing.   DO NOT use if you have an allergy to chlorhexidine /CHG or antibacterial soaps. If your skin becomes reddened or irritated, stop using the CHG and notify one of our RNs at [phone].   Please shower with the CHG soap starting 4 days before surgery using the following schedule:     Please keep in mind the following:  DO NOT shave, including legs and underarms, starting the day of your first shower.   You may shave your face at any point before/day of surgery.  Place clean sheets on your bed the day you start using CHG soap. Use a clean  washcloth (not used since being washed) for each shower. DO NOT sleep with pets once you start using the CHG.   CHG Shower Instructions:  Wash your face and private area with normal soap. If you choose to wash your hair, wash first with your normal shampoo.  After you use shampoo/soap, rinse your hair and body thoroughly to remove shampoo/soap residue.  Turn the water OFF and apply  bottle of CHG soap to a CLEAN washcloth.  Apply CHG soap ONLY FROM YOUR NECK DOWN TO YOUR TOES (washing for 3-5 minutes)  DO NOT use CHG soap on face, private areas, open wounds, or sores.  Pay special attention to the area where your surgery is being performed.  If you are having back surgery, having someone wash your back for you may be helpful. Wait 2 minutes after CHG soap is applied, then you may rinse off the CHG soap.  Pat dry with a clean towel  Put on clean clothes/pajamas   If you choose to wear lotion, please use ONLY the CHG-compatible lotions that are listed  below.  Additional instructions for the day of surgery:  If you choose, you may shower the morning of surgery with an antibacterial soap.  DO NOT APPLY any lotions, deodorants, cologne, or perfumes.   Do not bring valuables to the hospital. [facility] is not responsible for any belongings/valuables. Do not wear nail polish, gel polish, artificial nails, or any other type of covering on natural nails (fingers and toes) Do not wear jewelry or makeup Put on clean/comfortable clothes.  Please brush your teeth.  Ask your nurse before applying any prescription medications to the skin.     CHG Compatible Lotions   Aveeno Moisturizing lotion  Cetaphil Moisturizing Cream  Cetaphil Moisturizing Lotion  Clairol Herbal Essence Moisturizing Lotion, Dry Skin  Clairol Herbal Essence Moisturizing Lotion, Extra Dry Skin  Clairol Herbal Essence Moisturizing Lotion, Normal Skin  Curel Age Defying Therapeutic Moisturizing Lotion with Alpha Hydroxy  Curel Extreme Care Body Lotion  Curel Soothing Hands Moisturizing Hand Lotion  Curel Therapeutic Moisturizing Cream, Fragrance-Free  Curel Therapeutic Moisturizing Lotion, Fragrance-Free  Curel Therapeutic Moisturizing Lotion, Original Formula  Eucerin Daily Replenishing Lotion  Eucerin Dry Skin Therapy Plus Alpha Hydroxy Crme  Eucerin Dry Skin Therapy Plus Alpha Hydroxy Lotion  Eucerin Original Crme  Eucerin Original Lotion  Eucerin Plus Crme Eucerin Plus Lotion  Eucerin TriLipid Replenishing Lotion  Keri Anti-Bacterial Hand Lotion  Keri Deep Conditioning Original Lotion Dry Skin Formula Softly Scented  Keri Deep Conditioning Original Lotion, Fragrance Free Sensitive Skin Formula  Keri Lotion Fast Absorbing Fragrance Free Sensitive Skin Formula  Keri Lotion Fast Absorbing Softly Scented Dry Skin Formula  Keri Original Lotion  Keri Skin Renewal Lotion Keri Silky Smooth Lotion  Keri Silky Smooth Sensitive Skin Lotion  Nivea Body Creamy  Conditioning Oil  Nivea Body Extra Enriched Lotion  Nivea Body Original Lotion  Nivea Body Sheer Moisturizing Lotion Nivea Crme  Nivea Skin Firming Lotion  NutraDerm 30 Skin Lotion  NutraDerm Skin Lotion  NutraDerm Therapeutic Skin Cream  NutraDerm Therapeutic Skin Lotion  ProShield Protective Hand Cream  Provon moisturizing lotion  Please read over the following fact sheets that you were given.

## 2024-03-12 ENCOUNTER — Encounter (HOSPITAL_COMMUNITY)
Admission: RE | Admit: 2024-03-12 | Discharge: 2024-03-12 | Disposition: A | Discharge status: Home / Self Care | Source: Ambulatory Visit | Attending: Specialist | Admitting: Specialist

## 2024-03-12 ENCOUNTER — Other Ambulatory Visit: Payer: Self-pay

## 2024-03-12 ENCOUNTER — Encounter (HOSPITAL_COMMUNITY): Payer: Self-pay

## 2024-03-12 ENCOUNTER — Ambulatory Visit (HOSPITAL_COMMUNITY)
Admission: RE | Admit: 2024-03-12 | Discharge: 2024-03-12 | Disposition: A | Discharge status: Home / Self Care | Source: Ambulatory Visit | Attending: Orthopedic Surgery | Admitting: Orthopedic Surgery

## 2024-03-12 VITALS — BP 158/80 | HR 84 | Temp 98.0°F | Resp 18 | Ht 67.0 in | Wt 272.4 lb

## 2024-03-12 DIAGNOSIS — Z01818 Encounter for other preprocedural examination: Secondary | ICD-10-CM | POA: Diagnosis present

## 2024-03-12 DIAGNOSIS — E119 Type 2 diabetes mellitus without complications: Secondary | ICD-10-CM | POA: Diagnosis present

## 2024-03-12 DIAGNOSIS — M48062 Spinal stenosis, lumbar region with neurogenic claudication: Secondary | ICD-10-CM | POA: Insufficient documentation

## 2024-03-12 HISTORY — DX: Other specified chronic obstructive pulmonary disease: J44.89

## 2024-03-12 LAB — CBC
HCT: 41.7 % (ref 39.0–52.0)
Hemoglobin: 13.7 g/dL (ref 13.0–17.0)
MCH: 28 pg (ref 26.0–34.0)
MCHC: 32.9 g/dL (ref 30.0–36.0)
MCV: 85.3 fL (ref 80.0–100.0)
Platelets: 229 K/uL (ref 150–400)
RBC: 4.89 MIL/uL (ref 4.22–5.81)
RDW: 13.4 % (ref 11.5–15.5)
WBC: 6.1 K/uL (ref 4.0–10.5)
nRBC: 0 % (ref 0.0–0.2)

## 2024-03-12 LAB — BASIC METABOLIC PANEL WITH GFR
Anion gap: 9 (ref 5–15)
BUN: 15 mg/dL (ref 8–23)
CO2: 29 mmol/L (ref 22–32)
Calcium: 9.4 mg/dL (ref 8.9–10.3)
Chloride: 103 mmol/L (ref 98–111)
Creatinine, Ser: 1.36 mg/dL — ABNORMAL HIGH (ref 0.61–1.24)
GFR, Estimated: 57 mL/min — ABNORMAL LOW (ref >60)
Glucose, Bld: 88 mg/dL (ref 70–99)
Potassium: 3.9 mmol/L (ref 3.5–5.1)
Sodium: 141 mmol/L (ref 135–145)

## 2024-03-12 LAB — GLUCOSE, CAPILLARY: Glucose-Capillary: 99 mg/dL (ref 70–99)

## 2024-03-12 LAB — SURGICAL PCR SCREEN
MRSA, PCR: NEGATIVE
Staphylococcus aureus: NEGATIVE

## 2024-03-12 NOTE — Progress Notes (Signed)
 PCP - Shelda Atlas, MD  Cardiologist - denies  PPM/ICD - denies  Chest x-ray - n/a EKG - requested from PCP- pt reports normal EKG within the last year Stress Test - 02/15/15 ECHO - 01/12/13 Cardiac Cath - denies  Sleep Study - pt does not wear CPAP  Fasting Blood Sugar - pt does not check CBG at home. Pt reports his last A1c was 6.0 within the last month- record requested from PCP  Last dose of GLP1 agonist-  Mounjaro- last dose 03/07/24. Pt to hold this week's dose.    Blood Thinner Instructions:NA Aspirin  Instructions: Pt reports no ASA instructions- pt and wife verbalized understanding that they need to call surgeons office for instructions.   ERAS Protcol - ERAS + G2   COVID TEST- no   Anesthesia review: follow up requested records- PCP note, EKG and A1c result.   Patient denies shortness of breath, fever, cough and chest pain at PAT appointment   All instructions explained to the patient, with a verbal understanding of the material. Patient agrees to go over the instructions while at home for a better understanding.  The opportunity to ask questions was provided.

## 2024-03-12 NOTE — H&P (View-Only) (Signed)
 PCP - Shelda Atlas, MD  Cardiologist - denies  PPM/ICD - denies  Chest x-ray - n/a EKG - requested from PCP- pt reports normal EKG within the last year Stress Test - 02/15/15 ECHO - 01/12/13 Cardiac Cath - denies  Sleep Study - pt does not wear CPAP  Fasting Blood Sugar - pt does not check CBG at home. Pt reports his last A1c was 6.0 within the last month- record requested from PCP  Last dose of GLP1 agonist-  Mounjaro- last dose 03/07/24. Pt to hold this week's dose.    Blood Thinner Instructions:NA Aspirin  Instructions: Pt reports no ASA instructions- pt and wife verbalized understanding that they need to call surgeons office for instructions.   ERAS Protcol - ERAS + G2   COVID TEST- no   Anesthesia review: follow up requested records- PCP note, EKG and A1c result.   Patient denies shortness of breath, fever, cough and chest pain at PAT appointment   All instructions explained to the patient, with a verbal understanding of the material. Patient agrees to go over the instructions while at home for a better understanding.  The opportunity to ask questions was provided.

## 2024-03-13 ENCOUNTER — Ambulatory Visit (HOSPITAL_COMMUNITY)
Admission: RE | Admit: 2024-03-13 | Discharge: 2024-03-13 | Disposition: A | Discharge status: Home / Self Care | Source: Ambulatory Visit | Attending: Surgery | Admitting: Surgery

## 2024-03-13 DIAGNOSIS — M79605 Pain in left leg: Secondary | ICD-10-CM | POA: Diagnosis not present

## 2024-03-13 DIAGNOSIS — I739 Peripheral vascular disease, unspecified: Secondary | ICD-10-CM | POA: Insufficient documentation

## 2024-03-14 ENCOUNTER — Encounter (HOSPITAL_COMMUNITY): Payer: Self-pay

## 2024-03-14 LAB — VAS US ABI WITH/WO TBI
Left ABI: 1.11
Right ABI: 1.02

## 2024-03-15 ENCOUNTER — Ambulatory Visit (HOSPITAL_COMMUNITY): Admitting: Physician Assistant

## 2024-03-15 ENCOUNTER — Other Ambulatory Visit: Payer: Self-pay

## 2024-03-15 ENCOUNTER — Ambulatory Visit (HOSPITAL_COMMUNITY)

## 2024-03-15 ENCOUNTER — Ambulatory Visit (HOSPITAL_COMMUNITY)
Admission: RE | Admit: 2024-03-15 | Discharge: 2024-03-16 | Disposition: A | Discharge status: Home / Self Care | Attending: Specialist | Admitting: Specialist

## 2024-03-15 ENCOUNTER — Encounter (HOSPITAL_COMMUNITY): Payer: Self-pay | Admitting: Specialist

## 2024-03-15 ENCOUNTER — Encounter (HOSPITAL_COMMUNITY): Admission: RE | Disposition: A | Payer: Self-pay | Source: Home / Self Care | Attending: Specialist

## 2024-03-15 DIAGNOSIS — Z79899 Other long term (current) drug therapy: Secondary | ICD-10-CM | POA: Insufficient documentation

## 2024-03-15 DIAGNOSIS — Z7984 Long term (current) use of oral hypoglycemic drugs: Secondary | ICD-10-CM | POA: Diagnosis not present

## 2024-03-15 DIAGNOSIS — M48062 Spinal stenosis, lumbar region with neurogenic claudication: Secondary | ICD-10-CM | POA: Diagnosis not present

## 2024-03-15 DIAGNOSIS — Z87891 Personal history of nicotine dependence: Secondary | ICD-10-CM

## 2024-03-15 DIAGNOSIS — F32A Depression, unspecified: Secondary | ICD-10-CM | POA: Insufficient documentation

## 2024-03-15 DIAGNOSIS — Z7985 Long-term (current) use of injectable non-insulin antidiabetic drugs: Secondary | ICD-10-CM | POA: Insufficient documentation

## 2024-03-15 DIAGNOSIS — I10 Essential (primary) hypertension: Secondary | ICD-10-CM | POA: Insufficient documentation

## 2024-03-15 DIAGNOSIS — E119 Type 2 diabetes mellitus without complications: Secondary | ICD-10-CM | POA: Diagnosis not present

## 2024-03-15 DIAGNOSIS — F419 Anxiety disorder, unspecified: Secondary | ICD-10-CM | POA: Diagnosis not present

## 2024-03-15 DIAGNOSIS — Z7951 Long term (current) use of inhaled steroids: Secondary | ICD-10-CM | POA: Diagnosis not present

## 2024-03-15 DIAGNOSIS — M5416 Radiculopathy, lumbar region: Secondary | ICD-10-CM | POA: Diagnosis not present

## 2024-03-15 DIAGNOSIS — Z7982 Long term (current) use of aspirin: Secondary | ICD-10-CM | POA: Insufficient documentation

## 2024-03-15 DIAGNOSIS — M48061 Spinal stenosis, lumbar region without neurogenic claudication: Secondary | ICD-10-CM | POA: Diagnosis present

## 2024-03-15 DIAGNOSIS — J449 Chronic obstructive pulmonary disease, unspecified: Secondary | ICD-10-CM | POA: Insufficient documentation

## 2024-03-15 DIAGNOSIS — K219 Gastro-esophageal reflux disease without esophagitis: Secondary | ICD-10-CM | POA: Insufficient documentation

## 2024-03-15 DIAGNOSIS — G4730 Sleep apnea, unspecified: Secondary | ICD-10-CM | POA: Insufficient documentation

## 2024-03-15 DIAGNOSIS — J45909 Unspecified asthma, uncomplicated: Secondary | ICD-10-CM

## 2024-03-15 DIAGNOSIS — K279 Peptic ulcer, site unspecified, unspecified as acute or chronic, without hemorrhage or perforation: Secondary | ICD-10-CM | POA: Insufficient documentation

## 2024-03-15 HISTORY — PX: DECOMPRESSIVE LUMBAR LAMINECTOMY LEVEL 2: SHX5792

## 2024-03-15 LAB — GLUCOSE, CAPILLARY
Glucose-Capillary: 101 mg/dL — ABNORMAL HIGH (ref 70–99)
Glucose-Capillary: 137 mg/dL — ABNORMAL HIGH (ref 70–99)
Glucose-Capillary: 124 mg/dL — ABNORMAL HIGH (ref 70–99)
Glucose-Capillary: 138 mg/dL — ABNORMAL HIGH (ref 70–99)

## 2024-03-15 LAB — HEMOGLOBIN A1C
Hgb A1c MFr Bld: 6.1 % — ABNORMAL HIGH (ref 4.8–5.6)
Mean Plasma Glucose: 128.37 mg/dL

## 2024-03-15 SURGERY — DECOMPRESSIVE LUMBAR LAMINECTOMY LEVEL 2
Anesthesia: General | Site: Spine Lumbar

## 2024-03-15 MED ORDER — CEFAZOLIN SODIUM-DEXTROSE 2-4 GM/100ML-% IV SOLN
2.0000 g | Freq: Three times a day (TID) | INTRAVENOUS | Status: AC
  Administered 2024-03-15 (×2): 2 g via INTRAVENOUS
  Filled 2024-03-15 (×2): qty 100

## 2024-03-15 MED ORDER — PHENOL 1.4 % MT LIQD: 1.0000 | OROMUCOSAL | Status: DC | PRN

## 2024-03-15 MED ORDER — MAGNESIUM CITRATE PO SOLN: 1.0000 | Freq: Once | ORAL | Status: DC | PRN

## 2024-03-15 MED ORDER — LINAGLIPTIN 5 MG PO TABS: 5.0000 mg | ORAL_TABLET | Freq: Every day | ORAL | Status: DC

## 2024-03-15 MED ORDER — TRANEXAMIC ACID 1000 MG/10ML IV SOLN
2000.0000 mg | Freq: Once | INTRAVENOUS | Status: DC
  Filled 2024-03-15: qty 20

## 2024-03-15 MED ORDER — PANTOPRAZOLE SODIUM 40 MG PO TBEC: 40.0000 mg | DELAYED_RELEASE_TABLET | Freq: Every day | ORAL | Status: DC

## 2024-03-15 MED ORDER — MENTHOL 3 MG MT LOZG: 1.0000 | LOZENGE | OROMUCOSAL | Status: DC | PRN

## 2024-03-15 MED ORDER — TRANEXAMIC ACID 1000 MG/10ML IV SOLN
INTRAVENOUS | Status: DC | PRN
  Administered 2024-03-15: 2000 mg via TOPICAL

## 2024-03-15 MED ORDER — ONDANSETRON HCL 4 MG/2ML IJ SOLN
INTRAMUSCULAR | Status: AC
Start: 2024-03-15 — End: 2024-03-15
  Filled 2024-03-15: qty 2

## 2024-03-15 MED ORDER — HYDROMORPHONE HCL 1 MG/ML IJ SOLN
INTRAMUSCULAR | Status: AC
  Filled 2024-03-15: qty 0.5

## 2024-03-15 MED ORDER — DOXAZOSIN MESYLATE 8 MG PO TABS: 8.0000 mg | ORAL_TABLET | Freq: Every evening | ORAL | Status: DC

## 2024-03-15 MED ORDER — METHOCARBAMOL 1000 MG/10ML IJ SOLN: 500.0000 mg | Freq: Four times a day (QID) | INTRAMUSCULAR | Status: DC | PRN

## 2024-03-15 MED ORDER — DICYCLOMINE HCL 10 MG PO CAPS
10.0000 mg | ORAL_CAPSULE | Freq: Three times a day (TID) | ORAL | Status: DC | PRN
Start: 2024-03-15 — End: 2024-03-16

## 2024-03-15 MED ORDER — ACETAMINOPHEN 10 MG/ML IV SOLN
1000.0000 mg | INTRAVENOUS | Status: AC
  Administered 2024-03-15: 1000 mg via INTRAVENOUS
  Filled 2024-03-15: qty 100

## 2024-03-15 MED ORDER — CHLORHEXIDINE GLUCONATE 0.12 % MT SOLN
15.0000 mL | Freq: Once | OROMUCOSAL | Status: AC
  Administered 2024-03-15: 15 mL via OROMUCOSAL
  Filled 2024-03-15: qty 15

## 2024-03-15 MED ORDER — ACETAMINOPHEN 650 MG RE SUPP: 650.0000 mg | RECTAL | Status: DC | PRN

## 2024-03-15 MED ORDER — INSULIN ASPART 100 UNIT/ML IJ SOLN: 0.0000 [IU] | INTRAMUSCULAR | Status: DC | PRN

## 2024-03-15 MED ORDER — HYDRALAZINE HCL 50 MG PO TABS
50.0000 mg | ORAL_TABLET | Freq: Two times a day (BID) | ORAL | Status: DC
  Administered 2024-03-15: 50 mg via ORAL
  Filled 2024-03-15: qty 1

## 2024-03-15 MED ORDER — THROMBIN 20000 UNITS EX SOLR
CUTANEOUS | Status: AC
  Filled 2024-03-15: qty 20000

## 2024-03-15 MED ORDER — DEXAMETHASONE SOD PHOSPHATE PF 10 MG/ML IJ SOLN
INTRAMUSCULAR | Status: DC | PRN
Start: 2024-03-15 — End: 2024-03-15
  Administered 2024-03-15: 5 mg via INTRAVENOUS

## 2024-03-15 MED ORDER — PROPOFOL 10 MG/ML IV BOLUS
INTRAVENOUS | Status: DC | PRN
  Administered 2024-03-15: 150 mg via INTRAVENOUS

## 2024-03-15 MED ORDER — PROPOFOL 10 MG/ML IV BOLUS
INTRAVENOUS | Status: AC
Start: 2024-03-15 — End: 2024-03-15
  Filled 2024-03-15: qty 20

## 2024-03-15 MED ORDER — LIDOCAINE 2% (20 MG/ML) 5 ML SYRINGE
INTRAMUSCULAR | Status: DC | PRN
  Administered 2024-03-15: 40 mg via INTRAVENOUS

## 2024-03-15 MED ORDER — SUGAMMADEX SODIUM 200 MG/2ML IV SOLN
INTRAVENOUS | Status: DC | PRN
  Administered 2024-03-15: 200 mg via INTRAVENOUS
  Administered 2024-03-15: 100 mg via INTRAVENOUS

## 2024-03-15 MED ORDER — ACETAMINOPHEN 325 MG PO TABS: 650.0000 mg | ORAL_TABLET | ORAL | Status: DC | PRN

## 2024-03-15 MED ORDER — OXYMETAZOLINE HCL 0.05 % NA SOLN: 1.0000 | Freq: Two times a day (BID) | NASAL | Status: DC | PRN

## 2024-03-15 MED ORDER — KETAMINE HCL 50 MG/5ML IJ SOSY
PREFILLED_SYRINGE | INTRAMUSCULAR | Status: AC
  Filled 2024-03-15: qty 5

## 2024-03-15 MED ORDER — LACTATED RINGERS IV SOLN: INTRAVENOUS | Status: DC

## 2024-03-15 MED ORDER — SUCCINYLCHOLINE CHLORIDE 200 MG/10ML IV SOSY
PREFILLED_SYRINGE | INTRAVENOUS | Status: DC | PRN
  Administered 2024-03-15: 140 mg via INTRAVENOUS

## 2024-03-15 MED ORDER — COLCHICINE 0.6 MG PO TABS
0.6000 mg | ORAL_TABLET | Freq: Every day | ORAL | Status: DC
  Filled 2024-03-15: qty 1

## 2024-03-15 MED ORDER — THROMBIN 20000 UNITS EX SOLR: CUTANEOUS | Status: DC | PRN

## 2024-03-15 MED ORDER — ROCURONIUM BROMIDE 10 MG/ML (PF) SYRINGE
PREFILLED_SYRINGE | INTRAVENOUS | Status: DC | PRN
  Administered 2024-03-15: 60 mg via INTRAVENOUS
  Administered 2024-03-15 (×2): 10 mg via INTRAVENOUS

## 2024-03-15 MED ORDER — ACETAMINOPHEN 10 MG/ML IV SOLN: 1000.0000 mg | Freq: Once | INTRAVENOUS | Status: DC | PRN

## 2024-03-15 MED ORDER — METHOCARBAMOL 500 MG PO TABS
500.0000 mg | ORAL_TABLET | Freq: Four times a day (QID) | ORAL | Status: DC | PRN
  Administered 2024-03-15 – 2024-03-16 (×2): 500 mg via ORAL
  Filled 2024-03-15 (×2): qty 1

## 2024-03-15 MED ORDER — POLYETHYLENE GLYCOL 3350 17 G PO PACK: 17.0000 g | PACK | Freq: Every day | ORAL | 0 refills | Status: AC

## 2024-03-15 MED ORDER — BUDESON-GLYCOPYRROL-FORMOTEROL 160-9-4.8 MCG/ACT IN AERO
2.0000 | INHALATION_SPRAY | Freq: Two times a day (BID) | RESPIRATORY_TRACT | Status: DC
  Administered 2024-03-15: 2 via RESPIRATORY_TRACT
  Filled 2024-03-15: qty 5.9

## 2024-03-15 MED ORDER — RISAQUAD PO CAPS
1.0000 | ORAL_CAPSULE | Freq: Every day | ORAL | Status: DC
  Administered 2024-03-15: 1 via ORAL
  Filled 2024-03-15: qty 1

## 2024-03-15 MED ORDER — BISACODYL 5 MG PO TBEC: 5.0000 mg | DELAYED_RELEASE_TABLET | Freq: Every day | ORAL | Status: DC | PRN

## 2024-03-15 MED ORDER — ALBUTEROL SULFATE (2.5 MG/3ML) 0.083% IN NEBU: 2.5000 mg | INHALATION_SOLUTION | Freq: Four times a day (QID) | RESPIRATORY_TRACT | Status: DC | PRN

## 2024-03-15 MED ORDER — CEFAZOLIN SODIUM-DEXTROSE 3-4 GM/150ML-% IV SOLN
3.0000 g | INTRAVENOUS | Status: AC
  Administered 2024-03-15: 3 g via INTRAVENOUS
  Filled 2024-03-15: qty 150

## 2024-03-15 MED ORDER — PHENYLEPHRINE HCL-NACL 20-0.9 MG/250ML-% IV SOLN
INTRAVENOUS | Status: DC | PRN
Start: 2024-03-15 — End: 2024-03-15
  Administered 2024-03-15: 20 ug/min via INTRAVENOUS

## 2024-03-15 MED ORDER — LORATADINE 10 MG PO TABS
10.0000 mg | ORAL_TABLET | Freq: Every day | ORAL | Status: DC
  Administered 2024-03-15: 10 mg via ORAL
  Filled 2024-03-15: qty 1

## 2024-03-15 MED ORDER — OXYCODONE HCL 10 MG PO TABS: 10.0000 mg | ORAL_TABLET | ORAL | 0 refills | Status: AC | PRN

## 2024-03-15 MED ORDER — ALUM & MAG HYDROXIDE-SIMETH 200-200-20 MG/5ML PO SUSP: 30.0000 mL | Freq: Four times a day (QID) | ORAL | Status: DC | PRN

## 2024-03-15 MED ORDER — LIDOCAINE 2% (20 MG/ML) 5 ML SYRINGE
INTRAMUSCULAR | Status: AC
Start: 2024-03-15 — End: 2024-03-15
  Filled 2024-03-15: qty 5

## 2024-03-15 MED ORDER — LORATADINE 10 MG PO TABS: 5.0000 mg | ORAL_TABLET | Freq: Every day | ORAL | Status: DC

## 2024-03-15 MED ORDER — POLYETHYLENE GLYCOL 3350 17 G PO PACK: 17.0000 g | PACK | Freq: Every day | ORAL | Status: DC

## 2024-03-15 MED ORDER — BUPIVACAINE-EPINEPHRINE (PF) 0.5% -1:200000 IJ SOLN
INTRAMUSCULAR | Status: AC
Start: 2024-03-15 — End: 2024-03-15
  Filled 2024-03-15: qty 30

## 2024-03-15 MED ORDER — OXYCODONE HCL 5 MG/5ML PO SOLN: 5.0000 mg | Freq: Once | ORAL | Status: DC | PRN

## 2024-03-15 MED ORDER — PROPOFOL 10 MG/ML IV BOLUS
INTRAVENOUS | Status: AC
  Filled 2024-03-15: qty 20

## 2024-03-15 MED ORDER — OXYCODONE HCL 5 MG PO TABS: 5.0000 mg | ORAL_TABLET | ORAL | Status: DC | PRN

## 2024-03-15 MED ORDER — GABAPENTIN 100 MG PO CAPS
100.0000 mg | ORAL_CAPSULE | Freq: Three times a day (TID) | ORAL | Status: DC
  Administered 2024-03-15 (×2): 100 mg via ORAL
  Filled 2024-03-15 (×2): qty 1

## 2024-03-15 MED ORDER — ONDANSETRON HCL 4 MG/2ML IJ SOLN: 4.0000 mg | Freq: Four times a day (QID) | INTRAMUSCULAR | Status: DC | PRN

## 2024-03-15 MED ORDER — HYDROMORPHONE HCL 1 MG/ML IJ SOLN: 0.2500 mg | INTRAMUSCULAR | Status: DC | PRN

## 2024-03-15 MED ORDER — KETAMINE HCL 10 MG/ML IJ SOLN
INTRAMUSCULAR | Status: DC | PRN
  Administered 2024-03-15: 30 mg via INTRAVENOUS
  Administered 2024-03-15 (×2): 10 mg via INTRAVENOUS

## 2024-03-15 MED ORDER — DOCUSATE SODIUM 100 MG PO CAPS
100.0000 mg | ORAL_CAPSULE | Freq: Two times a day (BID) | ORAL | Status: DC
  Administered 2024-03-15: 100 mg via ORAL
  Filled 2024-03-15: qty 1

## 2024-03-15 MED ORDER — PRAVASTATIN SODIUM 10 MG PO TABS: 20.0000 mg | ORAL_TABLET | Freq: Every day | ORAL | Status: DC

## 2024-03-15 MED ORDER — ROCURONIUM BROMIDE 10 MG/ML (PF) SYRINGE
PREFILLED_SYRINGE | INTRAVENOUS | Status: AC
  Filled 2024-03-15: qty 10

## 2024-03-15 MED ORDER — FLUTICASONE PROPIONATE 50 MCG/ACT NA SUSP: 1.0000 | Freq: Every day | NASAL | Status: DC | PRN

## 2024-03-15 MED ORDER — DOCUSATE SODIUM 100 MG PO CAPS: 100.0000 mg | ORAL_CAPSULE | Freq: Two times a day (BID) | ORAL | 0 refills | Status: AC

## 2024-03-15 MED ORDER — OXYCODONE HCL 5 MG PO TABS: 5.0000 mg | ORAL_TABLET | Freq: Once | ORAL | Status: DC | PRN

## 2024-03-15 MED ORDER — VITAMIN D 25 MCG (1000 UNIT) PO TABS
5000.0000 [IU] | ORAL_TABLET | Freq: Every day | ORAL | Status: DC
  Administered 2024-03-15: 5000 [IU] via ORAL
  Filled 2024-03-15: qty 5

## 2024-03-15 MED ORDER — ONDANSETRON HCL 4 MG/2ML IJ SOLN
INTRAMUSCULAR | Status: DC | PRN
  Administered 2024-03-15: 4 mg via INTRAVENOUS

## 2024-03-15 MED ORDER — ORAL CARE MOUTH RINSE: 15.0000 mL | Freq: Once | OROMUCOSAL | Status: AC

## 2024-03-15 MED ORDER — EPHEDRINE SULFATE-NACL 50-0.9 MG/10ML-% IV SOSY
PREFILLED_SYRINGE | INTRAVENOUS | Status: DC | PRN
  Administered 2024-03-15 (×3): 5 mg via INTRAVENOUS

## 2024-03-15 MED ORDER — B COMPLEX VITAMINS PO CAPS: 1.0000 | ORAL_CAPSULE | Freq: Every day | ORAL | Status: DC

## 2024-03-15 MED ORDER — INSULIN ASPART 100 UNIT/ML IJ SOLN: 0.0000 [IU] | Freq: Three times a day (TID) | INTRAMUSCULAR | Status: DC

## 2024-03-15 MED ORDER — ONDANSETRON HCL 4 MG PO TABS: 4.0000 mg | ORAL_TABLET | Freq: Four times a day (QID) | ORAL | Status: DC | PRN

## 2024-03-15 MED ORDER — MIDAZOLAM HCL (PF) 2 MG/2ML IJ SOLN
INTRAMUSCULAR | Status: DC | PRN
  Administered 2024-03-15: 2 mg via INTRAVENOUS

## 2024-03-15 MED ORDER — OXYCODONE HCL 5 MG PO TABS
10.0000 mg | ORAL_TABLET | ORAL | Status: DC | PRN
  Administered 2024-03-15 – 2024-03-16 (×5): 10 mg via ORAL
  Filled 2024-03-15 (×5): qty 2

## 2024-03-15 MED ORDER — BUPIVACAINE-EPINEPHRINE 0.5% -1:200000 IJ SOLN
INTRAMUSCULAR | Status: DC | PRN
  Administered 2024-03-15: 6 mL

## 2024-03-15 MED ORDER — FENTANYL CITRATE (PF) 250 MCG/5ML IJ SOLN
INTRAMUSCULAR | Status: DC | PRN
  Administered 2024-03-15: 50 ug via INTRAVENOUS
  Administered 2024-03-15: 75 ug via INTRAVENOUS
  Administered 2024-03-15 (×2): 50 ug via INTRAVENOUS
  Administered 2024-03-15: 25 ug via INTRAVENOUS

## 2024-03-15 MED ORDER — 0.9 % SODIUM CHLORIDE (POUR BTL) OPTIME
TOPICAL | Status: DC | PRN
  Administered 2024-03-15: 1000 mL

## 2024-03-15 MED ORDER — TADALAFIL 5 MG PO TABS: 10.0000 mg | ORAL_TABLET | Freq: Every day | ORAL | Status: DC | PRN

## 2024-03-15 MED ORDER — SUGAMMADEX SODIUM 200 MG/2ML IV SOLN: INTRAVENOUS | Status: DC | PRN

## 2024-03-15 MED ORDER — TRANEXAMIC ACID-NACL 1000-0.7 MG/100ML-% IV SOLN
1000.0000 mg | INTRAVENOUS | Status: AC
  Administered 2024-03-15: 1000 mg via INTRAVENOUS
  Filled 2024-03-15: qty 100

## 2024-03-15 MED ORDER — HYDROMORPHONE HCL 1 MG/ML IJ SOLN: 0.5000 mg | INTRAMUSCULAR | Status: DC | PRN

## 2024-03-15 MED ORDER — FENTANYL CITRATE (PF) 250 MCG/5ML IJ SOLN
INTRAMUSCULAR | Status: AC
  Filled 2024-03-15: qty 5

## 2024-03-15 MED ORDER — MIDAZOLAM HCL 2 MG/2ML IJ SOLN
INTRAMUSCULAR | Status: AC
Start: 2024-03-15 — End: 2024-03-15
  Filled 2024-03-15: qty 2

## 2024-03-15 MED ORDER — DROPERIDOL 2.5 MG/ML IJ SOLN: 0.6250 mg | Freq: Once | INTRAMUSCULAR | Status: DC | PRN

## 2024-03-15 MED ORDER — KCL IN DEXTROSE-NACL 20-5-0.9 MEQ/L-%-% IV SOLN
INTRAVENOUS | Status: DC
  Filled 2024-03-15: qty 1000

## 2024-03-15 MED ORDER — B COMPLEX-C PO TABS
1.0000 | ORAL_TABLET | Freq: Every day | ORAL | Status: DC
  Filled 2024-03-15 (×2): qty 1

## 2024-03-15 SURGICAL SUPPLY — 53 items
BAG COUNTER SPONGE SURGICOUNT (BAG) ×2 IMPLANT
BAG DECANTER FOR FLEXI CONT (MISCELLANEOUS) IMPLANT
BAND RUBBER #18 3X1/16 STRL (MISCELLANEOUS) ×4 IMPLANT
BUR EGG ELITE 5.0 (BURR) IMPLANT
BUR RND DIAMOND ELITE 4.0 (BURR) IMPLANT
CLEANER TIP ELECTROSURG 2X2 (MISCELLANEOUS) ×2 IMPLANT
CNTNR URN SCR LID CUP LEK RST (MISCELLANEOUS) ×2 IMPLANT
DRAPE LAPAROTOMY 100X72X124 (DRAPES) ×2 IMPLANT
DRAPE MICROSCOPE LEICA (MISCELLANEOUS) ×2 IMPLANT
DRAPE SHEET LG 3/4 BI-LAMINATE (DRAPES) ×2 IMPLANT
DRAPE SURG 17X11 SM STRL (DRAPES) ×2 IMPLANT
DRAPE UTILITY XL STRL (DRAPES) ×2 IMPLANT
DRESSING AQUACEL AG SP 3.5X6 (GAUZE/BANDAGES/DRESSINGS) IMPLANT
DRSG AQUACEL AG ADV 3.5X 4 (GAUZE/BANDAGES/DRESSINGS) IMPLANT
DRSG AQUACEL AG ADV 3.5X 6 (GAUZE/BANDAGES/DRESSINGS) IMPLANT
DRSG TEGADERM 2-3/8X2-3/4 SM (GAUZE/BANDAGES/DRESSINGS) IMPLANT
DRSG TELFA 3X8 NADH STRL (GAUZE/BANDAGES/DRESSINGS) IMPLANT
DURAPREP 26ML APPLICATOR (WOUND CARE) ×2 IMPLANT
DURASEAL SPINE SEALANT 3ML (MISCELLANEOUS) IMPLANT
ELECTRODE BLDE 4.0 EZ CLN MEGD (MISCELLANEOUS) IMPLANT
ELECTRODE REM PT RTRN 9FT ADLT (ELECTROSURGICAL) ×2 IMPLANT
GAUZE SPONGE 4X4 12PLY STRL LF (GAUZE/BANDAGES/DRESSINGS) IMPLANT
GLOVE BIOGEL PI IND STRL 7.5 (GLOVE) ×2 IMPLANT
GLOVE SURG SS PI 7.0 STRL IVOR (GLOVE) ×2 IMPLANT
GLOVE SURG SS PI 8.0 STRL IVOR (GLOVE) ×4 IMPLANT
GOWN STRL REUS W/ TWL LRG LVL3 (GOWN DISPOSABLE) ×2 IMPLANT
GOWN STRL REUS W/ TWL XL LVL3 (GOWN DISPOSABLE) ×2 IMPLANT
IV CATH 14GX2 1/4 (CATHETERS) ×2 IMPLANT
KIT BASIN OR (CUSTOM PROCEDURE TRAY) ×2 IMPLANT
KIT POSITIONER JACKSON TABLE (MISCELLANEOUS) IMPLANT
NDL 22X1.5 STRL (OR ONLY) (MISCELLANEOUS) ×2 IMPLANT
NDL SPNL 18GX3.5 QUINCKE PK (NEEDLE) ×4 IMPLANT
NEEDLE 22X1.5 STRL (OR ONLY) (MISCELLANEOUS) ×1 IMPLANT
NEEDLE SPNL 18GX3.5 QUINCKE PK (NEEDLE) ×2 IMPLANT
PACK LAMINECTOMY NEURO (CUSTOM PROCEDURE TRAY) ×2 IMPLANT
PATTIES SURGICAL .75X.75 (GAUZE/BANDAGES/DRESSINGS) ×2 IMPLANT
SOLUTION PRONTOSAN WOUND 350ML (IRRIGATION / IRRIGATOR) IMPLANT
SPONGE SURGIFOAM ABS GEL 100 (HEMOSTASIS) ×2 IMPLANT
SPONGE T-LAP 4X18 ~~LOC~~+RFID (SPONGE) IMPLANT
STAPLER VISISTAT (STAPLE) IMPLANT
STRIP CLOSURE SKIN 1/2X4 (GAUZE/BANDAGES/DRESSINGS) ×2 IMPLANT
SUT NURALON 4 0 TR CR/8 (SUTURE) IMPLANT
SUT PROLENE 3 0 PS 2 (SUTURE) IMPLANT
SUT VIC AB 1 CT1 27XBRD ANTBC (SUTURE) IMPLANT
SUT VIC AB 1-0 CT2 27 (SUTURE) IMPLANT
SUT VIC AB 2-0 CT1 TAPERPNT 27 (SUTURE) IMPLANT
SUT VIC AB 2-0 CT2 27 (SUTURE) IMPLANT
SYR 3ML LL SCALE MARK (SYRINGE) ×2 IMPLANT
TOWEL GREEN STERILE (TOWEL DISPOSABLE) ×2 IMPLANT
TOWEL GREEN STERILE FF (TOWEL DISPOSABLE) ×2 IMPLANT
TRAY FOLEY MTR SLVR 16FR STAT (SET/KITS/TRAYS/PACK) ×2 IMPLANT
WIPE CHG 2% 2PK PREOPERATIVE (MISCELLANEOUS) ×2 IMPLANT
YANKAUER SUCT BULB TIP NO VENT (SUCTIONS) ×2 IMPLANT

## 2024-03-15 NOTE — Plan of Care (Signed)
 Problem: Education: Goal: Knowledge of General Education information will improve Description: Including pain rating scale, medication(s)/side effects and non-pharmacologic comfort measures 03/15/2024 1302 by Sherleen Flor, RN Outcome: Progressing 03/15/2024 1302 by Sherleen Flor, RN Outcome: Progressing   Problem: Health Behavior/Discharge Planning: Goal: Ability to manage health-related needs will improve 03/15/2024 1302 by Sherleen Flor, RN Outcome: Progressing 03/15/2024 1302 by Sherleen Flor, RN Outcome: Progressing   Problem: Clinical Measurements: Goal: Ability to maintain clinical measurements within normal limits will improve 03/15/2024 1302 by Sherleen Flor, RN Outcome: Progressing 03/15/2024 1302 by Sherleen Flor, RN Outcome: Progressing Goal: Will remain free from infection 03/15/2024 1302 by Sherleen Flor, RN Outcome: Progressing 03/15/2024 1302 by Sherleen Flor, RN Outcome: Progressing Goal: Diagnostic test results will improve 03/15/2024 1302 by Sherleen Flor, RN Outcome: Progressing 03/15/2024 1302 by Sherleen Flor, RN Outcome: Progressing Goal: Respiratory complications will improve 03/15/2024 1302 by Sherleen Flor, RN Outcome: Progressing 03/15/2024 1302 by Sherleen Flor, RN Outcome: Progressing Goal: Cardiovascular complication will be avoided 03/15/2024 1302 by Sherleen Flor, RN Outcome: Progressing 03/15/2024 1302 by Sherleen Flor, RN Outcome: Progressing   Problem: Activity: Goal: Risk for activity intolerance will decrease 03/15/2024 1302 by Sherleen Flor, RN Outcome: Progressing 03/15/2024 1302 by Sherleen Flor, RN Outcome: Progressing   Problem: Nutrition: Goal: Adequate nutrition will be maintained 03/15/2024 1302 by Sherleen Flor, RN Outcome: Progressing 03/15/2024 1302 by Sherleen Flor, RN Outcome: Progressing   Problem: Coping: Goal: Level of anxiety will decrease 03/15/2024  1302 by Sherleen Flor, RN Outcome: Progressing 03/15/2024 1302 by Sherleen Flor, RN Outcome: Progressing   Problem: Elimination: Goal: Will not experience complications related to bowel motility 03/15/2024 1302 by Sherleen Flor, RN Outcome: Progressing 03/15/2024 1302 by Sherleen Flor, RN Outcome: Progressing Goal: Will not experience complications related to urinary retention 03/15/2024 1302 by Sherleen Flor, RN Outcome: Progressing 03/15/2024 1302 by Sherleen Flor, RN Outcome: Progressing   Problem: Pain Managment: Goal: General experience of comfort will improve and/or be controlled 03/15/2024 1302 by Sherleen Flor, RN Outcome: Progressing 03/15/2024 1302 by Sherleen Flor, RN Outcome: Progressing   Problem: Safety: Goal: Ability to remain free from injury will improve 03/15/2024 1302 by Sherleen Flor, RN Outcome: Progressing 03/15/2024 1302 by Sherleen Flor, RN Outcome: Progressing   Problem: Skin Integrity: Goal: Risk for impaired skin integrity will decrease 03/15/2024 1302 by Sherleen Flor, RN Outcome: Progressing 03/15/2024 1302 by Sherleen Flor, RN Outcome: Progressing   Problem: Education: Goal: Ability to describe self-care measures that may prevent or decrease complications (Diabetes Survival Skills Education) will improve 03/15/2024 1302 by Sherleen Flor, RN Outcome: Progressing 03/15/2024 1302 by Sherleen Flor, RN Outcome: Progressing Goal: Individualized Educational Video(s) 03/15/2024 1302 by Sherleen Flor, RN Outcome: Progressing 03/15/2024 1302 by Sherleen Flor, RN Outcome: Progressing   Problem: Coping: Goal: Ability to adjust to condition or change in health will improve 03/15/2024 1302 by Sherleen Flor, RN Outcome: Progressing 03/15/2024 1302 by Sherleen Flor, RN Outcome: Progressing   Problem: Fluid Volume: Goal: Ability to maintain a balanced intake and output will improve 03/15/2024  1302 by Sherleen Flor, RN Outcome: Progressing 03/15/2024 1302 by Sherleen Flor, RN Outcome: Progressing   Problem: Health Behavior/Discharge Planning: Goal: Ability to identify and utilize available resources and services will improve 03/15/2024 1302 by Sherleen Flor, RN Outcome: Progressing 03/15/2024 1302 by Sherleen Flor, RN Outcome: Progressing Goal: Ability to manage health-related needs will improve 03/15/2024 1302 by Sherleen Flor, RN Outcome: Progressing 03/15/2024 1302 by Sherleen Flor, RN Outcome: Progressing   Problem: Metabolic: Goal: Ability to maintain appropriate glucose  levels will improve 03/15/2024 1302 by Sherleen Flor, RN Outcome: Progressing 03/15/2024 1302 by Sherleen Flor, RN Outcome: Progressing   Problem: Nutritional: Goal: Maintenance of adequate nutrition will improve 03/15/2024 1302 by Sherleen Flor, RN Outcome: Progressing 03/15/2024 1302 by Sherleen Flor, RN Outcome: Progressing Goal: Progress toward achieving an optimal weight will improve 03/15/2024 1302 by Sherleen Flor, RN Outcome: Progressing 03/15/2024 1302 by Sherleen Flor, RN Outcome: Progressing   Problem: Skin Integrity: Goal: Risk for impaired skin integrity will decrease 03/15/2024 1302 by Sherleen Flor, RN Outcome: Progressing 03/15/2024 1302 by Sherleen Flor, RN Outcome: Progressing   Problem: Tissue Perfusion: Goal: Adequacy of tissue perfusion will improve Outcome: Progressing   Problem: Education: Goal: Ability to verbalize activity precautions or restrictions will improve Outcome: Progressing Goal: Knowledge of the prescribed therapeutic regimen will improve Outcome: Progressing Goal: Understanding of discharge needs will improve Outcome: Progressing   Problem: Activity: Goal: Ability to avoid complications of mobility impairment will improve Outcome: Progressing Goal: Ability to tolerate increased activity will  improve Outcome: Progressing Goal: Will remain free from falls Outcome: Progressing   Problem: Bowel/Gastric: Goal: Gastrointestinal status for postoperative course will improve Outcome: Progressing   Problem: Clinical Measurements: Goal: Ability to maintain clinical measurements within normal limits will improve Outcome: Progressing Goal: Postoperative complications will be avoided or minimized Outcome: Progressing Goal: Diagnostic test results will improve Outcome: Progressing   Problem: Pain Management: Goal: Pain level will decrease Outcome: Progressing   Problem: Skin Integrity: Goal: Will show signs of wound healing Outcome: Progressing   Problem: Health Behavior/Discharge Planning: Goal: Identification of resources available to assist in meeting health care needs will improve Outcome: Progressing   Problem: Bladder/Genitourinary: Goal: Urinary functional status for postoperative course will improve Outcome: Progressing

## 2024-03-15 NOTE — Brief Op Note (Addendum)
 03/15/2024  7:26 AM  PATIENT:  [name]  68 y.o. male  PRE-OPERATIVE DIAGNOSIS:  SPINAL STENOSIS L3-4 AND L4-5  POST-OPERATIVE DIAGNOSIS:  * No post-op diagnosis entered *  PROCEDURE:  Procedure(s): DECOMPRESSIVE LUMBAR LAMINECTOMY LEVEL 2 (N/A)  SURGEON:  Surgeons and Role:    DEWAINE Duwayne Purchase, MD - Primary  PHYSICIAN ASSISTANT:   ASSISTANTS: Bissell   ANESTHESIA:   general  EBL:  200   BLOOD ADMINISTERED:none  DRAINS: Hemovac to self suction  LOCAL MEDICATIONS USED:  MARCAINE     SPECIMEN:  No Specimen  DISPOSITION OF SPECIMEN:  N/A  COUNTS:  YES  TOURNIQUET:  * No tourniquets in log *  DICTATION: .Other Dictation: Dictation Number [id number]  PLAN OF CARE: Admit for overnight observation  PATIENT DISPOSITION:  PACU - hemodynamically stable.   Delay start of Pharmacological VTE agent (>24hrs) due to surgical blood loss or risk of bleeding: yes

## 2024-03-15 NOTE — Anesthesia Postprocedure Evaluation (Signed)
 Anesthesia Post Note  Patient: [name]  Procedure(s) Performed: LUMBAR THREE-FOUR AND LUMBAR FOUR-FIVE DECOMPRESSIVE LUMBAR LAMINECTOMY (Spine Lumbar)     Patient location during evaluation: PACU Anesthesia Type: General Level of consciousness: awake and alert Pain management: pain level controlled Vital Signs Assessment: post-procedure vital signs reviewed and stable Respiratory status: spontaneous breathing, nonlabored ventilation, respiratory function stable and patient connected to nasal cannula oxygen  Cardiovascular status: blood pressure returned to baseline and stable Postop Assessment: no apparent nausea or vomiting Anesthetic complications: no   No notable events documented.  Last Vitals:  Vitals:   03/15/24 1130 03/15/24 1145  BP: (!) 148/75 (!) 171/70  Pulse: 92 (!) 112  Resp: 15 13  Temp:    SpO2: 99% 93%    Last Pain:  Vitals:   03/15/24 1145  TempSrc:   PainSc: 0-No pain                 Rome Ade

## 2024-03-15 NOTE — H&P (Signed)
 [name] is an 68 y.o. male.   Chief Complaint: Lower extremity weakness and pain with ambulation   HPI: Patient is a 68 year old male with predominately right over left lower extremity radicular pain secondary to severe spinal stenosis at L3-4 and been refractory to conservative treatment and presents for a lumbar decompression  Past Medical History:  Diagnosis Date   Acid reflux    Anxiety    Arthritis    Asthma    Borderline diabetic    Chronic obstructive bronchitis (HCC)    Diabetes mellitus without complication (HCC)    Hyperchloremia    Hyperlipidemia    Hypertension    Panic attacks    Restless leg syndrome    Seasonal allergies    Sleep apnea    does not wear CPAP   Spinal stenosis     Past Surgical History:  Procedure Laterality Date   BACK SURGERY     COLONOSCOPY     COLONOSCOPY WITH PROPOFOL  N/A 01/13/2023   Procedure: COLONOSCOPY WITH PROPOFOL ;  Surgeon: Leigh Elspeth SQUIBB, MD;  Location: [facility];  Service: Gastroenterology;  Laterality: N/A;   EXCISION MASS NECK N/A 09/16/2021   Procedure: EXCISION OF BENIGN SUPERFICIAL POSTERIOR MASS NECK;  Surgeon: Llewellyn Gerard LABOR, DO;  Location: [facility];  Service: ENT;  Laterality: N/A;   GASTRIC RESECTION     HEMOSTASIS CLIP PLACEMENT  01/13/2023   Procedure: HEMOSTASIS CLIP PLACEMENT;  Surgeon: Leigh Elspeth SQUIBB, MD;  Location: [facility];  Service: Gastroenterology;;   LUMBAR DISC SURGERY     POLYPECTOMY  01/13/2023   Procedure: POLYPECTOMY;  Surgeon: Leigh Elspeth SQUIBB, MD;  Location: [facility];  Service: Gastroenterology;;   REPLACEMENT TOTAL KNEE Bilateral    SUBMUCOSAL INJECTION  01/13/2023   Procedure: SUBMUCOSAL INJECTION;  Surgeon: Leigh Elspeth SQUIBB, MD;  Location: [facility];  Service: Gastroenterology;;   umbilical surgery  2023    Family History  Problem Relation Age of Onset   Heart attack Mother        hx 2 heart attacks   Colon cancer Neg Hx    Colon polyps Neg Hx     Esophageal cancer Neg Hx    Rectal cancer Neg Hx    Stomach cancer Neg Hx    Social History:  reports that he quit smoking about 26 years ago. His smoking use included cigarettes. He has never used smokeless tobacco. He reports that he does not currently use drugs after having used the following drugs: Marijuana. He reports that he does not drink alcohol.  Allergies: No Known Allergies  Medications Prior to Admission  Medication Sig Dispense Refill   acetaminophen  (TYLENOL ) 500 MG tablet Take 1,000 mg by mouth 2 (two) times daily as needed for headache or fever (pain).     albuterol  (PROVENTIL ) (2.5 MG/3ML) 0.083% nebulizer solution albuterol  sulfate 2.5 mg/3 mL (0.083 %) solution for nebulization USE 1 UNIT VIA NEBULIZER EVERY 6 HOURS AS NEEDED FOR COPD     Ascorbic Acid (VITAMIN C PO) Take 1 tablet by mouth daily.     aspirin  81 MG chewable tablet Chew 1 tablet (81 mg total) by mouth daily. 30 tablet 0   b complex vitamins capsule Take 1 capsule by mouth daily.     Cholecalciferol (VITAMIN D -3 PO) Take 1 capsule by mouth daily.     colchicine 0.6 MG tablet Take 0.6 mg by mouth daily.     cyclobenzaprine  (FLEXERIL ) 10 MG tablet Take 10 mg by mouth 3 (three) times  daily as needed for muscle spasms.     dicyclomine (BENTYL) 10 MG capsule Take 10 mg by mouth 3 (three) times daily as needed for spasms.     doxazosin  (CARDURA ) 8 MG tablet Take 8 mg by mouth every evening.     esomeprazole (NEXIUM) 40 MG capsule Take 40 mg by mouth daily.     fluticasone (FLONASE) 50 MCG/ACT nasal spray Place 1-2 sprays into both nostrils daily as needed for allergies.     Fluticasone-Umeclidin-Vilant (TRELEGY ELLIPTA) 200-62.5-25 MCG/ACT AEPB Inhale 2 puffs into the lungs daily.     gabapentin (NEURONTIN) 100 MG capsule Take 100 mg by mouth 3 (three) times daily.     hydrALAZINE  (APRESOLINE ) 50 MG tablet Take 50 mg by mouth 2 (two) times daily.     ibuprofen (ADVIL) 200 MG tablet Take 400 mg by mouth 2 (two)  times daily as needed for headache (pain).     levocetirizine (XYZAL) 5 MG tablet Take 5 mg by mouth daily.     metFORMIN (GLUCOPHAGE) 500 MG tablet Take 500 mg by mouth daily.     oxyCODONE -acetaminophen  (PERCOCET/ROXICET) 5-325 MG tablet Take 1-2 tablets by mouth 2 (two) times daily as needed for severe pain (pain score 7-10).     oxymetazoline (AFRIN) 0.05 % nasal spray Place 1-2 sprays into both nostrils 2 (two) times daily as needed for congestion.     pravastatin  (PRAVACHOL ) 20 MG tablet Take 20 mg by mouth daily.       sitaGLIPtin (JANUVIA) 100 MG tablet Take 100 mg by mouth daily.     tadalafil (CIALIS) 20 MG tablet Take 10-20 mg by mouth daily as needed (ED).     tirzepatide (MOUNJARO) 5 MG/0.5ML Pen Inject 5 mg into the skin every Wednesday.     fexofenadine (ALLEGRA) 180 MG tablet Take 180 mg by mouth daily as needed for allergies.      Results for orders placed or performed during the hospital encounter of 03/15/24 (from the past 48 hours)  Glucose, capillary     Status: Abnormal   Collection Time: 03/15/24  6:10 AM  Result Value Ref Range   Glucose-Capillary 101 (H) 70 - 99 mg/dL    Comment: Glucose reference range applies only to samples taken after fasting for at least 8 hours.   VAS US  ABI WITH/WO TBI Result Date: 03/14/2024  LOWER EXTREMITY DOPPLER STUDY Patient Name:  [name]  Date of Exam:   03/13/2024 Medical Rec #: [id number]     Accession #:    [id number] Date of Birth: [birth year]     Patient Gender: M Patient Age:   71 years Exam Location:  [address] Procedure:      VAS US  ABI WITH/WO TBI Referring Phys: Mickie Kozikowski --------------------------------------------------------------------------------  Indications: Patient reports chronic back issues and h/o back surgery with an              upcoming back surgery due soon. He reports constant pain from the              bilateral lateral hip to both ankles, right worse than left. He              does not report any  clinical claudication symptoms or rest pain. High Risk         Hypertension, hyperlipidemia, Diabetes, past history of Factors:          smoking.  Comparison Study: On 03/16/2022, a lower arterial Doppler showed an ABI of  1.22                   on the right and 1.16 on the left. Performing Technologist: Nanetta Shad RVT  Examination Guidelines: A complete evaluation includes at minimum, Doppler waveform signals and systolic blood pressure reading at the level of bilateral brachial, anterior tibial, and posterior tibial arteries, when vessel segments are accessible. Bilateral testing is considered an integral part of a complete examination. Photoelectric Plethysmograph (PPG) waveforms and toe systolic pressure readings are included as required and additional duplex testing as needed. Limited examinations for reoccurring indications may be performed as noted.  ABI Findings: +---------+------------------+-----+---------+--------+ Right    Rt Pressure (mmHg)IndexWaveform Comment  +---------+------------------+-----+---------+--------+ Brachial 178                                      +---------+------------------+-----+---------+--------+ PTA      184               1.02 triphasic         +---------+------------------+-----+---------+--------+ DP       180               1.00 triphasic         +---------+------------------+-----+---------+--------+ Great Toe176               0.98 Normal            +---------+------------------+-----+---------+--------+ +---------+------------------+-----+---------+-------+ Left     Lt Pressure (mmHg)IndexWaveform Comment +---------+------------------+-----+---------+-------+ Brachial 180                                     +---------+------------------+-----+---------+-------+ PTA      199               1.11 triphasic        +---------+------------------+-----+---------+-------+ DP       181               1.01 triphasic         +---------+------------------+-----+---------+-------+ Great Toe159               0.88 Normal           +---------+------------------+-----+---------+-------+ +-------+-----------+-----------+------------+------------+ ABI/TBIToday's ABIToday's TBIPrevious ABIPrevious TBI +-------+-----------+-----------+------------+------------+ Right  1.02       .98        1.22        .95          +-------+-----------+-----------+------------+------------+ Left   1.11       .88        1.16        .97          +-------+-----------+-----------+------------+------------+   Summary: Right: Resting right ankle-brachial index is within normal range. The right toe-brachial index is normal.  Left: Resting left ankle-brachial index is within normal range. The left toe-brachial index is normal.  *See table(s) above for measurements and observations.  No Vascular Consult Recommended. Electronically signed by Dorn Lesches MD on 03/14/2024 at 8:57:04 AM.    Final     Review of Systems  Musculoskeletal:  Positive for back pain and gait problem.  Neurological:  Positive for weakness and numbness.  All other systems reviewed and are negative.   Blood pressure (!) 147/88, pulse 78, temperature 97.9 F (36.6 C), temperature source Oral, resp. rate 18, height 5' 7 (1.702 m), weight 123.8 kg,  SpO2 95%. Physical Exam HENT:     Head: Normocephalic.     Nose: Nose normal.  Eyes:     Pupils: Pupils are equal, round, and reactive to light.  Cardiovascular:     Rate and Rhythm: Normal rate.  Pulmonary:     Effort: Pulmonary effort is normal.  Abdominal:     General: Abdomen is flat.  Musculoskeletal:     Cervical back: Normal range of motion.  Skin:    General: Skin is warm.     Capillary Refill: Capillary refill takes less than 2 seconds.  Neurological:     Mental Status: He is alert.     Motor: Weakness present.     Comments: Straight leg raise positive on the right.  Patient has quadricep  weakness left 4/5 on the right and left as well as hip flexor weakness 4/5 right compared to left.  2+ posterior tibial pulse on the right 1+ posterior tibial pulse on the left.  No DVT.  Normoreflexic.  Tender lumbosacral junction.  No flank pain with percussion   CT myelogram of the lumbar spine demonstrates an extension essentially a complete block at L3-4.  A previous decompression at L4-5.  Assessment/Plan 1.  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  Possible at L4-5.  Risk and benefits discussed including bleeding, infection, damage to nerve structures.  No change in symptoms or worsening symptoms DVT PE CSF leakage excetra.  An updated CT myelogram of the lumbar spine was requested and ordered.  However was not completed.  We discussed the previous CT myelogram the patient is reporting no changes in his symptomatology.  Mutually agreed to proceed without updating the CT myelogram and rescheduling the case. Reyes JAYSON Billing, MD 03/15/2024, 7:28 AM

## 2024-03-15 NOTE — Interval H&P Note (Signed)
 History and Physical Interval Note:  03/15/2024 7:34 AM  [name]  has presented today for surgery, with the diagnosis of SPINAL STENOSIS L3-4 AND L4-5.  The various methods of treatment have been discussed with the patient and family. After consideration of risks, benefits and other options for treatment, the patient has consented to  Procedure(s): DECOMPRESSIVE LUMBAR LAMINECTOMY LEVEL 2 (N/A) as a surgical intervention.  The patient's history has been reviewed, patient examined, no change in status, stable for surgery.  I have reviewed the patient's chart and labs.  Questions were answered to the patient's satisfaction.     [name]

## 2024-03-15 NOTE — Evaluation (Signed)
 Occupational Therapy Evaluation Patient Details Name: [name] MRN: [id number] DOB: [birth year] Today's Date: 03/15/2024   History of Present Illness   68 yo M s/p Revision central laminectomy of L3-L4 and L4-L5 with bilateral hemilaminotomies at L3-L4 and L4-L5 and foraminotomies of L4.  2. Lysis of epidural venous plexus.  PMH includes: Arthritis, HTN, DMT2     Clinical Impressions Patient admitted for the procedure above.  PTA he lives at home in a one level apartment with his spouse.  The spouse works as a PCA, and will be able to provide any needed assist.  Patient is very close to his baseline, needing no assist for lower body ADL from a sit to stand level, and is up walking in his room without assist.  Patient walking the halls independently, and was able to navigate 8 stairs without assist.  Precaution sheet issued with good understanding, and all questions answered.  No further OT needs in the acute setting.  Patient will be staying overnight due to his drain, but plans on leaving early tomorrow morning.       If plan is discharge home, recommend the following:   Assist for transportation     Functional Status Assessment   Patient has not had a recent decline in their functional status     Equipment Recommendations   None recommended by OT     Recommendations for Other Services         Precautions/Restrictions   Precautions Precautions: None Recall of Precautions/Restrictions: Intact Restrictions Weight Bearing Restrictions Per Provider Order: No     Mobility Bed Mobility Overal bed mobility: Modified Independent                  Transfers Overall transfer level: Modified independent                        Balance Overall balance assessment: No apparent balance deficits (not formally assessed)                                         ADL either performed or assessed with clinical judgement   ADL Overall  ADL's : At baseline                                       General ADL Comments: No assist needed sit to stand     Vision Patient Visual Report: No change from baseline       Perception Perception: Not tested       Praxis Praxis: Not tested       Pertinent Vitals/Pain Pain Assessment Pain Assessment: No/denies pain     Extremity/Trunk Assessment Upper Extremity Assessment Upper Extremity Assessment: Overall WFL for tasks assessed   Lower Extremity Assessment Lower Extremity Assessment: Defer to PT evaluation   Cervical / Trunk Assessment Cervical / Trunk Assessment: Back Surgery   Communication Communication Communication: No apparent difficulties   Cognition Arousal: Alert Behavior During Therapy: WFL for tasks assessed/performed Cognition: No apparent impairments                               Following commands: Intact       Cueing  General Comments   Cueing Techniques: Verbal  cues   VSS on RA   Exercises     Shoulder Instructions      Home Living Family/patient expects to be discharged to:: Private residence Living Arrangements: Spouse/significant other Available Help at Discharge: Family;Available 24 hours/day Type of Home: Apartment Home Access: Stairs to enter Entrance Stairs-Number of Steps: 2 Entrance Stairs-Rails: Right;Left;Can reach both Home Layout: One level     Bathroom Shower/Tub: Chief Strategy Officer: Standard Bathroom Accessibility: Yes How Accessible: Accessible via walker Home Equipment: Hand held shower head;Tub bench   Additional Comments: Spouse is a PCA      Prior Functioning/Environment Prior Level of Function : Independent/Modified Independent;Driving                    OT Problem List: Impaired balance (sitting and/or standing)   OT Treatment/Interventions:        OT Goals(Current goals can be found in the care plan section)   Acute Rehab OT  Goals Patient Stated Goal: Return home OT Goal Formulation: With patient Time For Goal Achievement: 03/19/24 Potential to Achieve Goals: Good   OT Frequency:       Co-evaluation              AM-PAC OT 6 Clicks Daily Activity     Outcome Measure Help from another person eating meals?: None Help from another person taking care of personal grooming?: None Help from another person toileting, which includes using toliet, bedpan, or urinal?: None Help from another person bathing (including washing, rinsing, drying)?: None Help from another person to put on and taking off regular upper body clothing?: None Help from another person to put on and taking off regular lower body clothing?: None 6 Click Score: 24   End of Session Nurse Communication: Mobility status  Activity Tolerance: Patient tolerated treatment well Patient left: in bed;with call bell/phone within reach  OT Visit Diagnosis: Muscle weakness (generalized) (M62.81)                Time: 8498-8477 OT Time Calculation (min): 21 min Charges:  OT General Charges $OT Visit: 1 Visit OT Evaluation $OT Eval Moderate Complexity: 1 Mod  03/15/2024  RP, OTR/L  Acute Rehabilitation Services  Office:  [phone]   Charlie JONETTA Halsted 03/15/2024, 3:29 PM

## 2024-03-15 NOTE — Transfer of Care (Signed)
 Immediate Anesthesia Transfer of Care Note  Patient: [name]  Procedure(s) Performed: LUMBAR THREE-FOUR AND LUMBAR FOUR-FIVE DECOMPRESSIVE LUMBAR LAMINECTOMY (Spine Lumbar)  Patient Location: PACU  Anesthesia Type:General  Level of Consciousness: awake, drowsy, and patient cooperative  Airway & Oxygen  Therapy: Patient connected to face mask oxygen   Post-op Assessment: Report given to RN, Post -op Vital signs reviewed and stable, and Patient moving all extremities  Post vital signs: Reviewed and stable  Last Vitals:  Vitals Value Taken Time  BP 151/67 03/15/24 11:22  Temp    Pulse 90 03/15/24 11:26  Resp 16 03/15/24 11:26  SpO2 99 % 03/15/24 11:26  Vitals shown include unfiled device data.  Last Pain:  Vitals:   03/15/24 0641  TempSrc:   PainSc: 0-No pain         Complications: No notable events documented.

## 2024-03-15 NOTE — Op Note (Signed)
 [name], [name] MEDICAL RECORD NO: [id number] ACCOUNT NO: [id number] DATE OF BIRTH: [birth year] FACILITY: [facility] LOCATION: [facility] PHYSICIAN: Reyes KYM Billing, MD  Operative Report   DATE OF PROCEDURE: 03/15/2024  PREOPERATIVE DIAGNOSES: 1. Spinal stenosis at L3-L4. 2. Recurrent stenosis at L4-L5.  POSTOPERATIVE DIAGNOSIS: Recurrent spinal stenosis L3-L4 and L4-L5.  PROCEDURE PERFORMED: 1. Revision central laminectomy of L3-L4 and L4-L5 with bilateral hemilaminotomies at L3-L4 and L4-L5 and foraminotomies of L4. 2. Lysis of epidural venous plexus.  ANESTHESIA: General.  ASSISTANT: Jaqueline Bissell, PA.  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 He had a complete block in extension on a myelogram at L3-L4. He had a history of a decompression reportedly only at L4-L5. It was indicated for the  decompression at L3-L4 and possibly at L4-L5. Risks and benefits were discussed including bleeding, infection, damage to neurovascular structures, no change in symptoms, worsening symptoms, DVT, PE, and anesthetic complications, etc.  DESCRIPTION OF PROCEDURE: The patient was placed in the supine position. After the induction of adequate general anesthesia, 3 g of Kefzol  was administered. The patient was then placed prone on the Wilson frame. All bony prominences were well padded. The  lumbar region was prepped and draped in the usual sterile fashion. Two 18-gauge spinal needle was utilized to localize the L3-L4 and L4-L5 interspace. An incision was made from the spinous process of L2 to the level of L4-L5. I excised the previous scar  in the skin, which was hypertrophic. Subcutaneous tissue was dissected.  Electrocautery utilized to achieve hemostasis . The dorsal lumbar fascia was divided in line with the skin incision. We first localized and identified the spinous process of L2 and  L3. There was no spinous process of L4. The paraspinous  muscles were elevated from the lamina of L2-L3, identifying the lamina of L3 bilaterally and the facet of L3-L4, and then continued the dissection to skeletonize the previous laminotomy at L4-L5,  extending down to the lamina of L5. Noted was the absence of a central lamina of L4, suggesting the previous decompression was at L4-L5 and at L3-L4. A Leksell rongeur was utilized to remove the spinous process of L3. We identified the facets at L3-L4  bilaterally and the lamina of L3. There was epidural fibrosis noted centrally at L4-L5 and some residual at L3-L4. I used a high-speed burr to debulk the lamina of L3. I then used a Woodson and a curette to further skeletonize the lamina. I then  performed a hemilaminotomy of L3, continuing cephalad centrally with a 2 mm Kerrison to the point cephalad to the point of detaching the residual ligamentum flavum. The hemilaminotomies were performed bilaterally. With the residual ligamentum flavum this  was identified. We entered the epidural space, and with the [facility], developed a plane between the thecal sac and the residual ligamentum flavum. This ligamentum flavum was removed bilaterally and into the lateral recesses bilaterally, which were  severely stenotic. I decompressed the lateral recesses to the medial border of the pedicle. An epidural venous plexus was noted on the right, which was identified, isolated, and cauterized. As I continued caudad, the ligamentum flavum was adhesed to the  previous epidural fibrosis at L4-L5. I debulked the epidural fibrosis. Remaining dorsal to the lamina. I then skeletonized the previous laminectomy of L4, and with a D'Errico and a curette I decompressed the stenosis bilaterally at L3-L4, extending down  to L4-L5 bilaterally. It was stenotic bilaterally. This was done utilizing a small curette,  Neuropatties, and a Facilities manager. Foraminotomies were performed at L4. There was a portion of the residual ligamentum flavum that was  severely adhesed to the  thecal sac. Multiple attempts to mobilize this and develop a plane were unsuccessful. I left a very small portion of the ligamentum flavum on the right dorsally. Following this, this extended down into the epidural fibrosis centrally. Following the  foraminotomies and decompression of the lateral recesses, the [facility] probe was passed freely out of the foramen of L3-L4 and down to the foramen of L5. I obtained confirmatory radiographs with a Woodson in the foramen at L3-L4 and a Penfield at the  L4-L5 lateral recess. We achieved strict hemostasis. I used a TXA-soaked sponge to help with hemostasis, and bipolar electrocautery was used throughout the case. The patient's blood pressure fluctuated, and with hypertension, increased the bleeding.  Anesthesia was continually encouraged for anesthesia to normalize blood pressure. Bone wax was placed on the cancellous surfaces. The ample pars remained after the decompression centrally. Good restoration of the thecal sac. No evidence of CSF leakage or  active bleeding. I removed the McCulloch retractor, and we had relaxed it periodically throughout the case. I copiously irrigated the wound. I then achieved strict hemostasis with bipolar electrocautery. Bone wax, thrombin soaked Gelfoam was placed in  the laminotomy defect and then removed. I placed a Hemovac and brought it out through a lateral stab wound in the skin. I closed the dorsal lumbar fascia with 1-0 Vicryl interrupted figure-of-eight sutures. Hemostasis was achieved. Subcutaneous tissue  was closed with 2-0 Vicryl and the skin with staples. The wound was dressed sterilely. The patient was placed supine on the hospital bed, extubated without difficulty, and transported to the recovery room in satisfactory condition.  The patient tolerated the procedure well. No complications. The assistant, Drue Randy, PA, was used throughout the case for patient positioning, gentle  intermittent neural traction, suction, and closure.  BLOOD LOSS: 200 mL.   PUS D: 03/15/2024 11:19:02 am T: 03/15/2024 12:55:00 pm  JOB: [id number]/ [id number]

## 2024-03-15 NOTE — Discharge Instructions (Signed)
 Walk As Tolerated utilizing back precautions.  No bending, twisting, or lifting.  No driving for 2 weeks.   Aquacel dressing may remain in place until follow up. May shower with aquacel dressing in place. If the dressing peels off or becomes saturated, you may remove aquacel dressing and place gauze and tape dressing which should be kept clean and dry and changed daily. Do not remove steri-strips if they are present. See Dr. Duwayne in office in 10 to 14 days. Begin taking aspirin 81mg  per day starting 4 days after your surgery if not allergic to aspirin or on another blood thinner. Walk daily even outside. Use a cane or walker only if necessary. Avoid sitting on soft sofas.

## 2024-03-15 NOTE — Anesthesia Procedure Notes (Signed)
 Procedure Name: Intubation Date/Time: 03/15/2024 7:51 AM  Performed by: Jerl Donald LABOR, CRNAPre-anesthesia Checklist: Patient identified, Emergency Drugs available, Suction available and Patient being monitored Patient Re-evaluated:Patient Re-evaluated prior to induction Oxygen  Delivery Method: Circle System Utilized Preoxygenation: Pre-oxygenation with 100% oxygen  Induction Type: IV induction and Rapid sequence Ventilation: Mask ventilation without difficulty Laryngoscope Size: Glidescope and 3 (limited cerival ROM, paresthesias in hands) Grade View: Grade I Tube type: Oral Tube size: 7.5 mm Number of attempts: 1 Airway Equipment and Method: Video-laryngoscopy and Rigid stylet Placement Confirmation: ETT inserted through vocal cords under direct vision, positive ETCO2 and breath sounds checked- equal and bilateral Secured at: 24 cm Tube secured with: Tape Dental Injury: Teeth and Oropharynx as per pre-operative assessment

## 2024-03-15 NOTE — Anesthesia Preprocedure Evaluation (Addendum)
 Anesthesia Evaluation  Patient identified by MRN, date of birth, ID band Patient awake    Reviewed: Allergy & Precautions, NPO status , Patient's Chart, lab work & pertinent test results  Airway Mallampati: IV  TM Distance: <3 FB Neck ROM: Full    Dental  (+) Teeth Intact, Dental Advisory Given   Pulmonary asthma , sleep apnea , COPD, former smoker   breath sounds clear to auscultation       Cardiovascular hypertension,  Rhythm:Regular Rate:Normal     Neuro/Psych  PSYCHIATRIC DISORDERS Anxiety Depression    negative neurological ROS     GI/Hepatic PUD,GERD  ,,  Endo/Other  diabetes    Renal/GU      Musculoskeletal   Abdominal   Peds  Hematology   Anesthesia Other Findings   Reproductive/Obstetrics                              Anesthesia Physical Anesthesia Plan  ASA: 3  Anesthesia Plan: General   Post-op Pain Management: Tylenol  PO (pre-op)* and Ketamine IV*   Induction: Intravenous  PONV Risk Score and Plan: 3 and Ondansetron , Dexamethasone  and Midazolam   Airway Management Planned: Oral ETT and Video Laryngoscope Planned  Additional Equipment: None  Intra-op Plan:   Post-operative Plan: Extubation in OR  Informed Consent: I have reviewed the patients History and Physical, chart, labs and discussed the procedure including the risks, benefits and alternatives for the proposed anesthesia with the patient or authorized representative who has indicated his/her understanding and acceptance.     Dental advisory given  Plan Discussed with: CRNA  Anesthesia Plan Comments:          Anesthesia Quick Evaluation

## 2024-03-16 ENCOUNTER — Telehealth (HOSPITAL_COMMUNITY): Payer: Self-pay | Admitting: Pharmacy Technician

## 2024-03-16 ENCOUNTER — Other Ambulatory Visit (HOSPITAL_COMMUNITY): Payer: Self-pay

## 2024-03-16 ENCOUNTER — Encounter (HOSPITAL_COMMUNITY): Payer: Self-pay | Admitting: Specialist

## 2024-03-16 DIAGNOSIS — M48062 Spinal stenosis, lumbar region with neurogenic claudication: Secondary | ICD-10-CM | POA: Diagnosis not present

## 2024-03-16 LAB — BASIC METABOLIC PANEL WITH GFR
Anion gap: 12 (ref 5–15)
BUN: 13 mg/dL (ref 8–23)
CO2: 24 mmol/L (ref 22–32)
Calcium: 9.3 mg/dL (ref 8.9–10.3)
Chloride: 101 mmol/L (ref 98–111)
Creatinine, Ser: 1.08 mg/dL (ref 0.61–1.24)
GFR, Estimated: >60 mL/min (ref >60)
Glucose, Bld: 121 mg/dL — ABNORMAL HIGH (ref 70–99)
Potassium: 4.3 mmol/L (ref 3.5–5.1)
Sodium: 137 mmol/L (ref 135–145)

## 2024-03-16 LAB — CBC
HCT: 38.5 % — ABNORMAL LOW (ref 39.0–52.0)
Hemoglobin: 12.9 g/dL — ABNORMAL LOW (ref 13.0–17.0)
MCH: 28.4 pg (ref 26.0–34.0)
MCHC: 33.5 g/dL (ref 30.0–36.0)
MCV: 84.6 fL (ref 80.0–100.0)
Platelets: 217 K/uL (ref 150–400)
RBC: 4.55 MIL/uL (ref 4.22–5.81)
RDW: 13.6 % (ref 11.5–15.5)
WBC: 11.5 K/uL — ABNORMAL HIGH (ref 4.0–10.5)
nRBC: 0 % (ref 0.0–0.2)

## 2024-03-16 LAB — GLUCOSE, CAPILLARY: Glucose-Capillary: 120 mg/dL — ABNORMAL HIGH (ref 70–99)

## 2024-03-16 NOTE — Telephone Encounter (Signed)
 Patient Product/process Development Scientist completed.    The patient is insured through [facility]. Patient has Medicare and is not eligible for a copay card, but may be able to apply for patient assistance or Medicare RX Payment Plan (Patient Must reach out to their plan, if eligible for payment plan), if available.    Ran test claim for Brez[employer]  and the current 30 day co-pay is $0.00.   This test claim was processed through [facility]- copay amounts may vary at other pharmacies due to pharmacy/plan contracts, or as the patient moves through the different stages of their insurance plan.     Reyes Sharps, CPHT Pharmacy Technician Patient Advocate Specialist Lead [facility] Patient Advocate Team Direct Number: [phone]  Fax: [phone]

## 2024-03-16 NOTE — Progress Notes (Signed)
